# Patient Record
Sex: Female | Born: 1995 | Race: Black or African American | Hispanic: No | Marital: Single | Smoking: Never smoker
Health system: Southern US, Community
[De-identification: ages and names within clinical notes are randomized; demographics above are authoritative.]

## PROBLEM LIST (undated history)

## (undated) DIAGNOSIS — G119 Hereditary ataxia, unspecified: Secondary | ICD-10-CM

## (undated) DIAGNOSIS — K219 Gastro-esophageal reflux disease without esophagitis: Secondary | ICD-10-CM

---

## 2015-07-13 ENCOUNTER — Encounter (HOSPITAL_COMMUNITY): Payer: Self-pay | Admitting: Emergency Medicine

## 2015-07-13 ENCOUNTER — Emergency Department (HOSPITAL_COMMUNITY)
Admission: EM | Admit: 2015-07-13 | Discharge: 2015-07-27 | Disposition: A | Discharge status: Home / Self Care | Payer: Medicaid Other | Attending: Emergency Medicine | Admitting: Emergency Medicine

## 2015-07-13 DIAGNOSIS — R45851 Suicidal ideations: Secondary | ICD-10-CM | POA: Diagnosis present

## 2015-07-13 DIAGNOSIS — F4325 Adjustment disorder with mixed disturbance of emotions and conduct: Secondary | ICD-10-CM | POA: Diagnosis present

## 2015-07-13 DIAGNOSIS — Z79899 Other long term (current) drug therapy: Secondary | ICD-10-CM | POA: Diagnosis not present

## 2015-07-13 HISTORY — DX: Hereditary ataxia, unspecified: G11.9

## 2015-07-13 LAB — COMPREHENSIVE METABOLIC PANEL
ALT: 17 U/L (ref 14–54)
ANION GAP: 8 (ref 5–15)
AST: 17 U/L (ref 15–41)
Albumin: 4.6 g/dL (ref 3.5–5.0)
Alkaline Phosphatase: 53 U/L (ref 38–126)
BILIRUBIN TOTAL: 0.6 mg/dL (ref 0.3–1.2)
BUN: 6 mg/dL (ref 6–20)
CO2: 23 mmol/L (ref 22–32)
Calcium: 9.7 mg/dL (ref 8.9–10.3)
Chloride: 108 mmol/L (ref 101–111)
Creatinine, Ser: 0.81 mg/dL (ref 0.44–1.00)
Glucose, Bld: 89 mg/dL (ref 65–99)
POTASSIUM: 3.9 mmol/L (ref 3.5–5.1)
SODIUM: 139 mmol/L (ref 135–145)
TOTAL PROTEIN: 7.8 g/dL (ref 6.5–8.1)

## 2015-07-13 LAB — RAPID URINE DRUG SCREEN, HOSP PERFORMED
Amphetamines: NOT DETECTED
BARBITURATES: NOT DETECTED
BENZODIAZEPINES: NOT DETECTED
COCAINE: NOT DETECTED
OPIATES: NOT DETECTED
TETRAHYDROCANNABINOL: NOT DETECTED

## 2015-07-13 LAB — CBC WITH DIFFERENTIAL/PLATELET
BASOS ABS: 0 10*3/uL (ref 0.0–0.1)
BASOS PCT: 1 %
EOS ABS: 0.2 10*3/uL (ref 0.0–0.7)
Eosinophils Relative: 3 %
HEMATOCRIT: 41 % (ref 36.0–46.0)
HEMOGLOBIN: 13.4 g/dL (ref 12.0–15.0)
Lymphocytes Relative: 43 %
Lymphs Abs: 2.7 10*3/uL (ref 0.7–4.0)
MCH: 25 pg — ABNORMAL LOW (ref 26.0–34.0)
MCHC: 32.7 g/dL (ref 30.0–36.0)
MCV: 76.4 fL — ABNORMAL LOW (ref 78.0–100.0)
Monocytes Absolute: 0.5 10*3/uL (ref 0.1–1.0)
Monocytes Relative: 8 %
NEUTROS ABS: 2.9 10*3/uL (ref 1.7–7.7)
NEUTROS PCT: 45 %
Platelets: 296 10*3/uL (ref 150–400)
RBC: 5.37 MIL/uL — ABNORMAL HIGH (ref 3.87–5.11)
RDW: 14.8 % (ref 11.5–15.5)
WBC: 6.3 10*3/uL (ref 4.0–10.5)

## 2015-07-13 LAB — ETHANOL

## 2015-07-13 NOTE — ED Notes (Signed)
TTS consult in process at bedside at this time. 
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
## 2015-07-13 NOTE — ED Notes (Signed)
Pt laying in bed, watching TV. Pt NAD. Sitter at bedside.

## 2015-07-13 NOTE — BH Assessment (Signed)
Assessment completed. Consulted Alberteen SamFran Hobson, NP who recommended inpatient treatment. TTS to seek placement. Informed Dr. Anitra LauthPlunkett of the recommendation.

## 2015-07-13 NOTE — ED Notes (Signed)
TTS consult completed 

## 2015-07-13 NOTE — ED Notes (Signed)
Pt changed into scrubs, belongings placed in bag and placed under nurses desk.
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
## 2015-07-13 NOTE — ED Notes (Signed)
Bed: WA25 Expected date:  Expected time:  Means of arrival:  Comments: SI attempt 

## 2015-07-14 DIAGNOSIS — F4325 Adjustment disorder with mixed disturbance of emotions and conduct: Secondary | ICD-10-CM

## 2015-07-14 MED ORDER — IBUPROFEN 800 MG PO TABS
800.0000 mg | ORAL_TABLET | Freq: Once | ORAL | Status: AC
  Administered 2015-07-14: 800 mg via ORAL
  Filled 2015-07-14: qty 1
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
## 2015-07-14 NOTE — [facility] Suicide Risk Assessment (Signed)
Suicide Risk Assessment  Discharge Assessment   [facility]   Principal Problem: Adjustment disorder with mixed disturbance of emotions and conduct Discharge Diagnoses:  Patient Active Problem List   Diagnosis Date Noted  . Adjustment disorder with mixed disturbance of emotions and conduct [F43.25] 07/14/2015    Priority: High    Total Time spent with patient: 45 minutes   Musculoskeletal: Strength & Muscle Tone: decreased Gait & Station: unsteady Patient leans: N/A  Psychiatric Specialty Exam: Physical Exam  Constitutional: She is oriented to person, place, and time. She appears well-developed and well-nourished.  HENT:  Head: Normocephalic.  Neck: Normal range of motion.  Respiratory: Effort normal.  Musculoskeletal: Normal range of motion.  Neurological: She is alert and oriented to person, place, and time.  Skin: Skin is warm and dry.  Psychiatric: Her speech is normal and behavior is normal. Judgment and thought content normal. Her mood appears anxious. Cognition and memory are normal.    Review of Systems  Constitutional: Negative.   HENT: Negative.   Eyes: Negative.   Respiratory: Negative.   Cardiovascular: Negative.   Gastrointestinal: Negative.   Genitourinary: Negative.   Musculoskeletal: Negative.   Skin: Negative.   Neurological: Negative.   Endo/Heme/Allergies: Negative.   Psychiatric/Behavioral: The patient is nervous/anxious.     Blood pressure 126/67, pulse 94, temperature 98.2 F (36.8 C), temperature source Oral, resp. rate 17, SpO2 98 %.There is no height or weight on file to calculate BMI.  General Appearance: Casual  Eye Contact:  Good  Speech:  Normal Rate, slightly slow due to ataxia   Volume:  Normal  Mood:  Euthymic  Affect:  Congruent  Thought Process:  Coherent and Descriptions of Associations: Intact  Orientation:  Full (Time, Place, and Person)  Thought Content:  WDL  Suicidal Thoughts:  No  Homicidal  Thoughts:  No  Memory:  Immediate;   Good Recent;   Good Remote;   Good  Judgement:  Fair  Insight:  Fair  Psychomotor Activity:  Decreased  Concentration:  Concentration: Good and Attention Span: Good  Recall:  Good  Fund of Knowledge:  Good  Language:  Good  Akathisia:  No  Handed:  Right  AIMS (if indicated):     Assets:  Housing Leisure Time Physical Health Resilience Social Support  ADL's:  Intact  Cognition:  WNL  Sleep:       Mental Status Per Nursing Assessment::   On Admission:   IVC'd by her sister   Demographic Factors:  Adolescent or young adult  Loss Factors: NA  Historical Factors: NA  Risk Reduction Factors:   Sense of responsibility to family, Living with another person, especially a relative and Positive social support  Continued Clinical Symptoms:  None  Cognitive Features That Contribute To Risk:  None    Suicide Risk:  Minimal: No identifiable suicidal ideation.  Patients presenting with no risk factors but with morbid ruminations; may be classified as minimal risk based on the severity of the depressive symptoms    Plan Of Care/Follow-up recommendations:  Activity:  as tolerated Diet:  heart healthy diet  Nayda Riesen, NP 07/14/2015, 12:14 PM

## 2015-07-14 NOTE — Progress Notes (Signed)
Spoke with Karen QuillMagistrate Thomas who stated he was not going to re-designate her as an IVC patient.  Awaiting placement/decision from Department of Social Services.  Barrie LymeVance, Onesimo Lingard E RN 8:46 PM 07/14/2015
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
## 2015-07-14 NOTE — ED Notes (Signed)
Pt sitting up in bed, remains watching TV, NAD. Sitter at bedside

## 2015-07-14 NOTE — ED Notes (Signed)
Pt appears to be sleeping in bed, respirations even and unlabored. Sitter remains at bedside.
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
## 2015-07-15 MED ORDER — ACETAMINOPHEN 325 MG PO TABS: 650.0000 mg | ORAL_TABLET | Freq: Once | ORAL | Status: DC

## 2015-07-15 MED ORDER — ACETAMINOPHEN 325 MG PO TABS
650.0000 mg | ORAL_TABLET | ORAL | Status: DC | PRN
  Administered 2015-07-15 – 2015-07-27 (×8): 650 mg via ORAL
  Filled 2015-07-15 (×9): qty 2

## 2015-07-15 NOTE — ED Notes (Signed)
Pt now c/o back pain.

## 2015-07-16 MED ORDER — CALCIUM CARBONATE-VITAMIN D 500-200 MG-UNIT PO TABS
1.0000 | ORAL_TABLET | Freq: Every day | ORAL | Status: DC
  Administered 2015-07-16 – 2015-07-27 (×12): 1 via ORAL
  Filled 2015-07-16 (×12): qty 1

## 2015-07-16 MED ORDER — LORATADINE 10 MG PO TABS
10.0000 mg | ORAL_TABLET | Freq: Every day | ORAL | Status: DC
  Administered 2015-07-16 – 2015-07-27 (×12): 10 mg via ORAL
  Filled 2015-07-16 (×12): qty 1

## 2015-07-16 NOTE — ED Notes (Addendum)
Mother Delfin EdisMelenee Burrell, would like to speak with a Child psychotherapistocial Worker.  Mother's number is [phone].  Social Worker informed of mother's desire to speak with her.  Mother would like to petition for patient's guardianship and stated, "Since her sister don't want her, I don't want her to go to a home."  Mother [place] in [place], [place].

## 2015-07-16 NOTE — Progress Notes (Addendum)
Phoned pharmacy to see if they can find out the pharmacy the pt used while living in [place] . Per mom pt needs her medications but the pt is only able to tell the writer she takes vitamins and something for her allergies. Pt stated she would get her meds [facility] [place]. Pt c/o right hip pain . 2 tylenol given. (12:45p) Pt was assisted to the BR with her walker. Had a large BM. Pt was set up for lunch and is eating w/o assistance. She remains pleasant and cooperative. Pt [place]. Spoke to American FinancialJosh Greenberg who stated the last time the pt had her meds was on May 9th 2017 prior to her moving to [place]. Josh stated at that time the pt received a progesterone injection and is due for this every 3 months. Phoned Abah in the pharmacy and gave her Tilford PillarJosh Greenberg's contact number : [phone]. Pt is s/p a shower with asssitance. Tolerated well. Pt is working on word find puzzles. (2:30pm) Spoke with EDP concerning med reconcilation. (2:45pm) Report to oncoming shift(3pm)

## 2015-07-16 NOTE — Progress Notes (Signed)
Provided written information to assist pt with determining choice for uninsured accepting pcps, discussed the importance of pcp vs EDP services for f/u care, www.needymeds.org, www.goodrx.com, discounted pharmacies and other [place] resources such as [facility] , [facility], affordable care act, financial assistance, uninsured dental services, [place] med assist, DSS and  health department  Reviewed resources for [place] uninsured accepting pcps like [facility], family medicine at [address], [facility], [facility], local urgent care centers, [facility], [facility] family practice, general medical clinics, family services of the [place], [facility] urgent care plus others, medication resources, [facility] out patient pharmacies and housing Pt voiced understanding and appreciation of resources provided   Provided P4CC contact information Pt [place] been [place] with her sister not a P4CC candidate at this time  Placed items in pt belonging bag in locker number #30

## 2015-07-16 NOTE — ED Notes (Signed)
Patient ate 100% of breakfast and drank juice, coffee, milk and water.
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
## 2015-07-16 NOTE — ED Notes (Signed)
Patient sitting upright in chair eating breakfast.

## 2015-07-16 NOTE — ED Notes (Signed)
Patient's earrings in the shape of a cross with clear stones were placed in her locker.

## 2015-07-16 NOTE — Progress Notes (Signed)
CSW spoke with Assistant CSW Director who has spoke with owner of [facility], Betty Davis who wants information regarding pt.  CSW will fax owner a referral packet in order to try and obtain appropriate placement.  Sofhia Ulibarri, LCSWA [phone] ED CSW 07/16/2015 6:34 PM  
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
## 2015-07-16 NOTE — ED Notes (Signed)
Patient ambulated to bathroom with some assistance.

## 2015-07-16 NOTE — ED Notes (Signed)
Patient states her best friend Ermalinda Memosngelika, is going to bring her mother to visit from [place].  Angelika's number is [phone].
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
## 2015-07-17 MED ORDER — DIPHENHYDRAMINE HCL 25 MG PO CAPS
25.0000 mg | ORAL_CAPSULE | Freq: Once | ORAL | Status: AC
  Administered 2015-07-17: 25 mg via ORAL
  Filled 2015-07-17: qty 1
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
## 2015-07-17 NOTE — Progress Notes (Signed)
Attempted call to [facility] [phone] [facility] or Malon Kindlenthony [facility] [phone]. Unable to leave messages on either line.  Message sent to Jinny SandersBetty Davis, [facility] regarding placement. Waiting for response.  Elenore PaddyLaVonia Shawntavia Saunders, LCSWA [phone] ED CSW 8:28 AM 07/17/2015

## 2015-07-17 NOTE — Progress Notes (Signed)
Sister and sister's boyfriend [facility]. She also had several large bins of clothes and other personal belongings. Nurse informed boyfriend that there was not enough storage to leave all of the patient's bin. Therefore, only the walker was left behind.  Boyfriend states that him and patient's sister are currently sharing a car, but that when he gets off of work he will take the patient the rest of her belongings.   Trish MageBrittney Malyah Ohlrich, LCSWA [phone] ED CSW 07/17/2015 8:43 PM

## 2015-07-17 NOTE — Progress Notes (Signed)
CSW reached out to sister/guardian. She confirms that she is aware that pt has been [facility] a [facility].  CSW informed sister that she has spoken with physical therapy who states that pt will need her walker in order to ambulate. CSW asked sister would she be able to [facility] the [facility]'s [facility] or to her new care home. Sister states that she will bring the walker to [facility] tonight.  CSW made nurse and PT aware.  Trish MageBrittney Mable Dara, LCSWA [phone] ED CSW 07/17/2015 4:58 PM

## 2015-07-17 NOTE — Progress Notes (Addendum)
CSW met with Karen Hart with [facility] and she assessed patient in the ED. She stated she will accept patient at her facility in the morning (07-18-15) around 9:00am. Updated NP and Asst. Social Work Mudlogger.  CSW spoke with patient's legal guardian, Karen Hart to inform her that patient will be going to [facility] in the morning around 9:00am. CSW informed her patient asked about her clothes and she stated she could take them to the transition house. CSW provided her with the address and will call back and give her a contact number for Karen Hart.  CSW called back and provided Karen Hart, Legal Guardian the contact number for [employer]. Contact number is [phone].  [facility] [address] [place], [place] [address]   Genice Rouge [phone] ED CSW 07/17/2015 1:01 PM

## 2015-07-17 NOTE — Evaluation (Signed)
Physical Therapy Evaluation Patient Details Name: Karen Hart MRN: [id number] DOB: 1995-09-19 Today's Date: 07/17/2015   History of Present Illness  Pt with history of cerebellar ataxia presents with ataxic movements and generalized weakness from decreased mobility in recent month.   Clinical Impression  Very pleasant and sweet session with patient today. Pt with history of ataxic movement patterns evident upon evaluation of BUES and BLEs as well. Difficult to assess in one session, however today with little mobility patient seemed to fatigue quickly with some generalized deconditioning on top of her challenge with her ataxic gait needing cues , RW assistance for safety to keep it near, and min assist for ambulation to assure safety. Pt will need to have assistance and her RW  (at least initial until patient can prove otherwise) for all mobility and ADLS for she is a fall risk.  She will benefit from walking short household distances with assistance to maintain and help improve her activity tolerance and safety with gait. This can be accomplished by HHPT or a caregiver . We will continue to see her while she is here at [facility]. I was not able to test, however feel patient would not be able to navigate steps, she would have to have a ramp, and possibly a WC at times.     Follow Up Recommendations Home health PT    Equipment Recommendations   (pt has a 4 wheeled RW , her sister stated she would bring it up to the hopsital for pateint to use. IF not , patient will have to have RW ordered for her, preferably the 4 wheeled RW due to her ataxia diagnosis if possible. )    Recommendations for Other Services       Precautions / Restrictions Precautions Precautions: Fall Precaution Comments: feel patient is a fall risk and at this time would need someone with her while she is walkign and trying to go to the bathroom and mobilize. Pt fatigues easily too.       Mobility  Bed Mobility Overal bed  mobility: Modified Independent                Transfers Overall transfer level: Needs assistance Equipment used: Rolling walker (2 wheeled) Transfers: Sit to/from Stand Sit to Stand: Min guard         General transfer comment: little assistance to rise and RW use safely   Ambulation/Gait Ambulation/Gait assistance: Min assist Ambulation Distance (Feet): 18 Feet Assistive device: Rolling walker (2 wheeled) Gait Pattern/deviations: Step-through pattern;Ataxic     General Gait Details: pt fatgued quickly and noticable in UES and LES, BLES atacix with gait, and continued verbal and tactile cues for RW safety for she pushed it too far in front of her with each step. She listend and corrected with assist to prevent any loss of balance.   Stairs            Wheelchair Mobility    Modified Rankin (Stroke Patients Only)       Balance Overall balance assessment: Needs assistance Sitting-balance support: No upper extremity supported Sitting balance-Leahy Scale: Good     Standing balance support: Bilateral upper extremity supported Standing balance-Leahy Scale: Fair                               Pertinent Vitals/Pain Pain Assessment: No/denies pain    Home Living Family/patient expects to be discharged to::  (unsure, social worker is working  with this. Pt was in [place] with her mom, until her mom coul dnot longer care for her. She has recently come to [place] where her sister is caring for her,. )                      Prior Function Level of Independence: Independent with assistive device(s)         Comments: difficult to gather accurate timeline. Pt states she usually uses her walker (sounds like a 4 wheeled walker with brakes , seat and a basket) short distances, and doe snot have a wheelchair. People push her while sitting on teh walker seat, however this scares her for she knows it can tip.      Hand Dominance         Extremity/Trunk Assessment               Lower Extremity Assessment: Generalized weakness (generalized weakness, with ataxic movment patterns equally in BLEs and BUEs as well. Pt fatigues quickly , and has poor trunk strength compensating with trunk flexion for shoulder elevation. )         Communication   Communication: Expressive difficulties (mild expressive dysarthria, takes increased time and some fragmented sentences, but able to understand and follow her thoughts and communication. )  Cognition Arousal/Alertness: Awake/alert Behavior During Therapy: WFL for tasks assessed/performed Overall Cognitive Status: Within Functional Limits for tasks assessed                      General Comments      Exercises Other Exercises Other Exercises: perfomed B LE ankle pumps 10x, hip and knee flexion exercises 10 BLE, staraight leg raises 10x BLE.       Assessment/Plan    PT Assessment Patient needs continued PT services  PT Diagnosis Difficulty walking;Generalized weakness   PT Problem List Decreased strength;Decreased activity tolerance;Decreased balance;Decreased mobility;Decreased knowledge of use of DME  PT Treatment Interventions Gait training;Functional mobility training;Therapeutic activities;Therapeutic exercise   PT Goals (Current goals can be found in the Care Plan section) Acute Rehab PT Goals Patient Stated Goal: I want to move more for I get stiff.  PT Goal Formulation: With patient Time For Goal Achievement: 07/31/15 Potential to Achieve Goals: Good    Frequency Min 2X/week   Barriers to discharge        Co-evaluation               End of Session Equipment Utilized During Treatment: Gait belt Activity Tolerance: Patient tolerated treatment well Patient left: in bed Nurse Communication: Mobility status    Functional Assessment Tool Used: clinical judgement Functional Limitation: Mobility: Walking and moving around Mobility: Walking and  Moving Around Current Status (Z6109): At least 20 percent but less than 40 percent impaired, limited or restricted Mobility: Walking and Moving Around Goal Status [phone]): At least 1 percent but less than 20 percent impaired, limited or restricted    Time: 0981-1914 PT Time Calculation (min) (ACUTE ONLY): 25 min   Charges:   PT Evaluation $PT Eval Low Complexity: 1 Procedure PT Treatments $Gait Training: 8-22 mins   PT G Codes:   PT G-Codes **NOT FOR INPATIENT CLASS** Functional Assessment Tool Used: clinical judgement Functional Limitation: Mobility: Walking and moving around Mobility: Walking and Moving Around Current Status (N8295): At least 20 percent but less than 40 percent impaired, limited or restricted Mobility: Walking and Moving Around Goal Status [phone]): At least 1 percent but less than 20  percent impaired, limited or restricted    Gregary Blackard, Clois DupesSHARRON 07/17/2015, 5:08 PM Marella BileSharron Jemiah Ellenburg, PT Pager: [phone] 07/17/2015

## 2015-07-17 NOTE — ED Notes (Signed)
Pt c/o itching all over her back, benadryl given
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
## 2015-07-18 NOTE — NC FL2 (Signed)
  [place] MEDICAID FL2 LEVEL OF CARE SCREENING TOOL     IDENTIFICATION  Patient Name: Karen Hart Birthdate: 08/10/1995 Sex: female Admission Date (Current Location): 07/13/2015  [facility] [place]   Facility and Address:  [facility],  [address], [place] [id number]      Provider Number: [id number]  Attending Physician Name and Address:  Provider Default, MD  Relative Name and Phone Number:       Current Level of Care: Hospital Recommended Level of Care: Family Care Home Prior Approval Number:    Date Approved/Denied:   PASRR Number:    Discharge Plan: Other (Comment) (Family Care Home)    Current Diagnoses: Patient Active Problem List   Diagnosis Date Noted  . 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    Orientation RESPIRATION BLADDER Height & Weight     Self  Normal Continent Weight:   Height:     BEHAVIORAL SYMPTOMS/MOOD NEUROLOGICAL BOWEL NUTRITION STATUS      Continent Diet  AMBULATORY STATUS COMMUNICATION OF NEEDS Skin   Limited Assist (Per Nurse, needs stand by short distances, has the four wheel walker) Verbally Normal                       Personal Care Assistance Level of Assistance  Bathing, Feeding, Dressing Bathing Assistance: Limited assistance Feeding assistance: Independent (Per Nurse, can feed self after set-up) Dressing Assistance: Limited assistance     Functional Limitations Info  Sight, Hearing, Speech Sight Info: Adequate (Patient wears glasses) Hearing Info: Adequate Speech Info: Adequate    SPECIAL CARE FACTORS FREQUENCY                       Contractures      Additional Factors Info  Code Status, Allergies (Full Code) Code Status Info:  (Full Code) Allergies Info:  (Latex)           Current Medications (07/18/2015):  This is the current hospital active medication list Current Facility-Administered Medications  Medication Dose Route  Frequency Provider Last Rate Last Dose  . acetaminophen (TYLENOL) tablet 650 mg  650 mg Oral Q4H PRN Antony MaduraKelly Humes, PA-C   650 mg at 07/16/15 1244  . calcium-vitamin D (OSCAL WITH D) 500-200 MG-UNIT per tablet 1 tablet  1 tablet Oral Daily Lorre NickAnthony Allen, MD   1 tablet at 07/17/15 [id number]  . loratadine (CLARITIN) tablet 10 mg  10 mg Oral Daily Lorre NickAnthony Allen, MD   10 mg at 07/17/15 [id number]   Current Outpatient Prescriptions  Medication Sig Dispense Refill  . Calcium Carbonate-Vitamin D (CALCIUM-VITAMIN D) 500-200 MG-UNIT tablet Take 1 tablet by mouth daily.    . cetirizine (ZYRTEC) 10 MG tablet Take 10 mg by mouth daily.    Marland Kitchen. escitalopram (LEXAPRO) 20 MG tablet Take 20 mg by mouth daily. Reported on 07/16/2015    . ferrous sulfate 325 (65 FE) MG EC tablet Take 325 mg by mouth daily with breakfast.    . MAGNESIUM SULFATE PO Take 1 tablet by mouth daily.       Discharge Medications: Please see discharge summary for a list of discharge medications.  Relevant Imaging Results:  Relevant Lab Results:   Additional Information    Claudean SeveranceLaVonia M Zareen Jamison, LCSW

## 2015-07-18 NOTE — Progress Notes (Signed)
Per chart and registration the pt not have any insurance listed.  CSW reached out to sister who states that she has found a medicaid number for pt : [id number] Marin OlpO  Tiani Stanbery, LCSWA [phone] ED CSW 07/18/2015 10:44 PM

## 2015-07-18 NOTE — Progress Notes (Addendum)
CSW faxed patient's information this AM to Clarene Essexonya Torain [facility] for [facility]. Fax# [phone]. Waiting for response to see if patient is appropriate for their facility.  Elenore PaddyLaVonia Jesselle Laflamme, LCSWA [phone] ED CSW 07/18/2015 2:47 PM

## 2015-07-18 NOTE — Progress Notes (Signed)
CSW reached out to sister to inquire about pt income.  Sister states that she is not sure how pt was paying for her previous facility because at that time the pt was a ward of the state. According to sister, the pt was receiving a disability check, but it got cut off. She states that the pt is supposed to start receiving a survivors check, but it has not started yet.  Sister states that she has no questions for CSW at this time.  Trish MageBrittney Aryannah Mohon, LCSWA [phone] ED CSW 07/18/2015 9:07 PM

## 2015-07-18 NOTE — Progress Notes (Signed)
CSW spoke with patient's legal guardian, Ovidio Kinovella Rivera to inform her that [facility] would not be [facility] with Jinny SandersBetty Davis. CSW will follow-up.  Elenore PaddyLaVonia Dayvian Blixt, LCSWA [phone] ED CSW 07/18/2015 9:26 AM

## 2015-07-18 NOTE — ED Notes (Signed)
Writer took pt for a short walk with her 4 point walker to get her legs some exercise. When return to room writer put pt in a wheel chair and took pt outside for fresh air and sunlight. Pt enjoyed being outside. Writer also took pt to Environmental health practitionergift shop and writer brought pt a Office managerfidget spinner and a few snacks pt was very thankful. Pt is having a great day.

## 2015-07-18 NOTE — ED Notes (Signed)
Pt c/o back itching.  Lotion applied.  Pt now stating the itching has stopped.

## 2015-07-19 LAB — URINALYSIS, ROUTINE W REFLEX MICROSCOPIC
Bilirubin Urine: NEGATIVE
GLUCOSE, UA: NEGATIVE mg/dL
Hgb urine dipstick: NEGATIVE
KETONES UR: NEGATIVE mg/dL
LEUKOCYTES UA: NEGATIVE
NITRITE: NEGATIVE
PH: 6 (ref 5.0–8.0)
Protein, ur: NEGATIVE mg/dL
SPECIFIC GRAVITY, URINE: 1.009 (ref 1.005–1.030)

## 2015-07-19 NOTE — Progress Notes (Signed)
Physical Therapy Treatment Patient Details Name: Karen Hart MRN: [id number] DOB: 02/10/1995 Today's Date: 07/19/2015    History of Present Illness Pt with history of cerebellar ataxia presents with ataxic movements and generalized weakness from decreased mobility in recent month.     PT Comments    The  Patient has significant ataxia and is a fall risk. The patient requires 1 assist for ambulation using 4 wheeled RW. The patient may benefit from a small, light weight WC to self mobilize more safely. This would improve patient's ability to mobilize in a facility/grouphome. PT Will return and assess the patient with transfers and  WC mobility.  Follow Up Recommendations  Home health PT     Equipment Recommendations  Wheelchair (measurements PT);Wheelchair cushion (measurements PT)    Recommendations for Other Services       Precautions / Restrictions Precautions Precautions: Fall Precaution Comments: feel patient is a fall risk and at this time would need someone with her while she is walkign and trying to go to the bathroom and mobilize. Pt fatigues easily too.     Mobility  Bed Mobility                  Transfers Overall transfer level: Needs assistance Equipment used: 4-wheeled walker Transfers: Sit to/from Stand Sit to Stand: Min guard         General transfer comment: little assistance to rise and RW use safely   Ambulation/Gait Ambulation/Gait assistance: Min assist Ambulation Distance (Feet): 50 Feet Assistive device: 4-wheeled walker Gait Pattern/deviations: Step-to pattern;Step-through pattern;Wide base of support;Ataxic;Shuffle;Decreased weight shift to right;Decreased weight shift to left;Trunk flexed   Gait velocity interpretation: <1.8 ft/sec, indicative of risk for recurrent falls General Gait Details: pt locks brakes apprropriately, stops frequently to get trunk into RW, also needs to stop to rest, pushes  RW far ahead at times.   Stairs             Wheelchair Mobility    Modified Rankin (Stroke Patients Only)       Balance Overall balance assessment: Needs assistance   Sitting balance-Leahy Scale: Normal     Standing balance support: Bilateral upper extremity supported;During functional activity Standing balance-Leahy Scale: Poor                      Cognition Arousal/Alertness: Awake/alert                          Exercises      General Comments        Pertinent Vitals/Pain Pain Assessment: No/denies pain    Home Living                      Prior Function            PT Goals (current goals can now be found in the care plan section) Progress towards PT goals: Progressing toward goals    Frequency  Min 2X/week    PT Plan Current plan remains appropriate    Co-evaluation             End of Session Equipment Utilized During Treatment: Gait belt Activity Tolerance: Patient tolerated treatment well Patient left: in chair;with call bell/phone within reach;with nursing/sitter in room     Time: 0900-0926 PT Time Calculation (min) (ACUTE ONLY): 26 min  Charges:  $Gait Training: 23-37 mins  G Codes:      Karen Hart, Karen Hart 07/19/2015, 12:04 PM Karen Hart PT [phone]
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
## 2015-07-19 NOTE — Evaluation (Signed)
Occupational Therapy Evaluation Patient Details Name: Karen Hart MRN: [id number] DOB: 01/21/1996 Today's Date: 07/19/2015    History of Present Illness Pt with history of cerebellar ataxia presents with ataxic movements and generalized weakness from decreased mobility in recent month.    Clinical Impression   Pt admitted with above. She demonstrates the below listed deficits and will benefit from continued OT to maximize safety and independence with BADLs.  Pt presents to OT with ataxia and likely baseline deficits.  She currently requires min - mod A for ADLs.  She was provided with adaptive equipment to improve her independence with bathing, dressing, self feeding.  Will follow acutely.  Feel she will benefit from continued OT at discharge, but this will be dependent upon discharge disposition.         Follow Up Recommendations  Home health OT;SNF;Supervision/Assistance - 24 hour    Equipment Recommendations  3 in 1 bedside comode;Wheelchair (measurements OT);Tub/shower bench (DME will be dependent on discharge disposition )    Recommendations for Other Services       Precautions / Restrictions Precautions Precautions: Fall Precaution Comments: Pt is fall risk due to ataxia       Mobility Bed Mobility                  Transfers Overall transfer level: Needs assistance Equipment used: 4-wheeled walker Transfers: Sit to/from BJ'sStand;Stand Pivot Transfers Sit to Stand: Min guard Stand pivot transfers: Min assist       General transfer comment: Pt requires cues for sequencing, proper set up of wheelchair and proper hand placement     Balance Overall balance assessment: Needs assistance Sitting-balance support: Feet supported Sitting balance-Leahy Scale: Good     Standing balance support: Bilateral upper extremity supported Standing balance-Leahy Scale: Poor                              ADL Overall ADL's : Needs  assistance/impaired Eating/Feeding: Set up;Minimal assistance;Sitting Eating/Feeding Details (indicate cue type and reason): frequently drops items.  difficulty manipulating utensil Grooming: Wash/dry hands;Wash/dry face;Oral care;Minimal assistance;Supervision/safety;Sitting Grooming Details (indicate cue type and reason): variable performance due to ataxia  Upper Body Bathing: Moderate assistance;Sitting Upper Body Bathing Details (indicate cue type and reason): frequently drops soap and washcloth  Lower Body Bathing: Maximal assistance;Sit to/from stand Lower Body Bathing Details (indicate cue type and reason): frequently drops soap and washcloth  Upper Body Dressing : Maximal assistance;Sitting   Lower Body Dressing: Moderate assistance;Sit to/from stand Lower Body Dressing Details (indicate cue type and reason): Pt is able to don/doff socks Toilet Transfer: Min Barrister's clerkguard;Stand-pivot;BSC Toilet Transfer Details (indicate cue type and reason): instructed pt on correct positioning of DME, and safe hand placement  Toileting- Clothing Manipulation and Hygiene: Moderate assistance;Sit to/from stand       Functional mobility during ADLs: Min guard;Minimal assistance General ADL Comments: Pt provided with wash mitten, foam built up handles for utensils and lidded mug.  She and sitter were instructed in their use and pt able to return demo with supervision      Vision     Perception     Praxis      Pertinent Vitals/Pain Pain Assessment: No/denies pain     Hand Dominance     Extremity/Trunk Assessment Upper Extremity Assessment Upper Extremity Assessment: RUE deficits/detail;LUE deficits/detail RUE Deficits / Details: ataxia  RUE Coordination: decreased gross motor;decreased fine motor LUE Deficits / Details: ataxia  LUE Coordination:  decreased fine motor;decreased gross motor   Lower Extremity Assessment Lower Extremity Assessment: Defer to PT evaluation   Cervical / Trunk  Assessment Cervical / Trunk Assessment: Other exceptions Cervical / Trunk Exceptions: truncal ataxia    Communication Communication Communication: Expressive difficulties;Other (comment) (dysarthria)   Cognition Arousal/Alertness: Awake/alert Behavior During Therapy: WFL for tasks assessed/performed Overall Cognitive Status: History of cognitive impairments - at baseline                     General Comments       Exercises Exercises: Other exercises Other Exercises Other Exercises: Pt was provided with red theraputty and was instructed in pinching exercises - requires mod cues    Shoulder Instructions      Home Living Family/patient expects to be discharged to:: Unsure                                 Additional Comments: Pt previously was living in a group home, then with her sister       Prior Functioning/Environment Level of Independence: Needs assistance  Gait / Transfers Assistance Needed: h/o falls per chart ADL's / Homemaking Assistance Needed: Pt reports she has been receiving help for ADLs since she graduated from high school         OT Diagnosis: Generalized weakness;Cognitive deficits;Ataxia   OT Problem List: Impaired balance (sitting and/or standing);Decreased coordination;Decreased safety awareness;Decreased knowledge of use of DME or AE;Impaired UE functional use   OT Treatment/Interventions: Self-care/ADL training;Neuromuscular education;Therapeutic exercise;DME and/or AE instruction;Therapeutic activities;Patient/family education;Balance training    OT Goals(Current goals can be found in the care plan section) Acute Rehab OT Goals Patient Stated Goal: Pt did not state  OT Goal Formulation: With patient Time For Goal Achievement: 08/02/15 Potential to Achieve Goals: Good ADL Goals Pt Will Perform Eating: with set-up;with adaptive utensils;sitting Pt Will Perform Upper Body Bathing: with set-up;sitting;with adaptive equipment Pt  Will Perform Lower Body Bathing: with min assist;with adaptive equipment;sit to/from stand Pt Will Perform Upper Body Dressing: with min assist;with adaptive equipment;sitting Pt Will Perform Lower Body Dressing: with min assist;with adaptive equipment;sit to/from stand Pt Will Transfer to Toilet: with min guard assist;stand pivot transfer;bedside commode Pt Will Perform Toileting - Clothing Manipulation and hygiene: with min assist;sit to/from stand Pt/caregiver will Perform Home Exercise Program: Increased strength;Right Upper extremity;Left upper extremity;With theraband;With Supervision;With written HEP provided  OT Frequency: Min 2X/week   Barriers to D/C: Decreased caregiver support          Co-evaluation              End of Session Equipment Utilized During Treatment: Other (comment) (wc) Nurse Communication: Mobility status  Activity Tolerance: Patient tolerated treatment well Patient left: in chair;with call bell/phone within reach;with nursing/sitter in room   Time: 1549-1626 OT Time Calculation (min): 37 min Charges:  OT General Charges $OT Visit: 1 Procedure OT Evaluation $OT Eval Moderate Complexity: 1 Procedure OT Treatments $Self Care/Home Management : 8-22 mins G-Codes: OT G-codes **NOT FOR INPATIENT CLASS** Functional Limitation: Self care Self Care Current Status (U9811(G8987): At least 40 percent but less than 60 percent impaired, limited or restricted Self Care Goal Status (B1478(G8988): At least 20 percent but less than 40 percent impaired, limited or restricted  Brinda Focht M 07/19/2015, 5:01 PM

## 2015-07-19 NOTE — Progress Notes (Signed)
Physical Therapy Treatment Patient Details Name: Horton Finerydrah Chilson MRN: [id number] DOB: 04/07/1995 Today's Date: 07/19/2015    History of Present Illness Pt with history of cerebellar ataxia presents with ataxic movements and generalized weakness from decreased mobility in recent month.     PT Comments    The patient was evaluated for  Wheelchair mobility. She required cues and min guard for transfers, cues for mobilizing with the  WC using both legs and arms. Demonstrated improved technique with practice. . The ataxia of extremities is somewhat limiting in control of  The legs and arms, cautioned her to not get feet caught in wheels.The  patient  Was able to self propel 3280' with ocassional cues from PT and  Assist for turns in [facility]  by PT. The patient can benefit from a WC to  Safely mobilize versus using the RW. She is  Glorious PeachFreer to move around via a WC. Could utilize a WC  if  Discharged to a facility with out having to have a 1:1 assistant.  Follow Up Recommendations  Home health PT     Equipment Recommendations  Wheelchair (measurements PT);Wheelchair cushion (measurements PT) The patient fits in a 16 inch  Width WC.   Recommendations for Other Services       Precautions / Restrictions Precautions Precautions: Fall Precaution Comments: feel patient is a fall risk and at this time would need someone with her while she is walkign and trying to go to the bathroom and mobilize. Pt fatigues easily too.     Mobility  WC Mobility The patient was instructed in use of legs and arms to propel the WC. Coordination of the legs and arms was an issue initially. At times the patient just used arms  Or the legs separately. Instructed in use of 1 Wheel rim and feet to turn the WC. The patient was happy to be out and mobilizing.                   Transfers Overall transfer level: Needs assistance Equipment used:  (WC) Transfers: Stand Pivot Transfers Sit to Stand: Min guard Stand pivot transfers:  Min assist       General transfer comment: cues for locking WC brakes, cues for safety. Transferred from recliner to [facility].  Ambulation/Gait  Stairs            Wheelchair Mobility    Modified Rankin (Stroke Patients Only)       Balance Overall balance assessment: Needs assistance   Sitting balance-Leahy Scale: Normal     Standing balance support: Bilateral upper extremity supported;During functional activity Standing balance-Leahy Scale: Poor                      Cognition Arousal/Alertness: Awake/alert                          Exercises      General Comments        Pertinent Vitals/Pain Pain Assessment: No/denies pain    Home Living                      Prior Function            PT Goals (current goals can now be found in the care plan section) Progress towards PT goals: Progressing toward goals    Frequency  Min 2X/week    PT Plan Current plan remains appropriate    Co-evaluation  End of Session Equipment Utilized During Treatment: Gait belt Activity Tolerance: Patient tolerated treatment well Patient left: in chair;with call bell/phone within reach;with nursing/sitter in room (in [facility])     Time: [id number] PT Time Calculation (min) (ACUTE ONLY): 25 min  Charges:   $Wheel Chair Management: 23-37 mins                    G Codes:      Rada HayHill, Yashira Offenberger Elizabeth 07/19/2015, 12:27 PM

## 2015-07-19 NOTE — Progress Notes (Addendum)
CSW [facility] to [facility] to make care coordinator referral for patient. However, once speaking with the clinician/Margaret she states that a referral was automatically made due to this being the first crisis incident the pt has had.  CSW still currently working on [employer]. [place] must stated that information given was incorrect ... Either birthday or  SS number. CSW spoke with registration to verify the patient SS number, and it stated it was correct. CSW will continue to follow up.  Trish MageBrittney Whitaker, LCSWA [phone] ED CSW 07/19/2015 11:12 PM
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
## 2015-07-20 LAB — URINE CULTURE

## 2015-07-20 NOTE — Progress Notes (Addendum)
8:17am- CSW attempted to speak with patient's legal guardian, Karen Hart. Spoke with patient's boyfriend to see if social security number and date of birth are both correct. He stated they were getting ready to go in for a surgery and CSW would have to call back this evening.   Spoke with registration and they stated the information they have in the system is what is noted at this time.  Spoke with patient to obtain information. Patient looked at her arm band to tell CSW her DOB. She told CSW her social security number. The number she provided is the number in the system. Unable to complete application to obtain PASRR at this time.  CSW spoke with [facility] Hart at [facility] to complete the Care Coordination referral. He stated one had been completed because patient was flagged due to being in the hospital. He stated he would attempt to have patient's referral expedited and call back.   CSW attempted a telephone call to [facility], [place], [place] [phone] to identify a correct social security number for patient in efforts to complete application for PASRR number. Message left with name and contact number for return call.  Message sent to supervisor to see if patient's information would be appropriate to send to ALF through the hub.   [name], LCSWA [phone] ED CSW 07/20/2015 10:44 AM

## 2015-07-20 NOTE — Progress Notes (Addendum)
Report received. Pt is in the bed talking to her mom. Appears comfortable. 4:30pm Pt was assisted in the shower.Report to oncoming shift. (7;20pm)

## 2015-07-20 NOTE — Progress Notes (Signed)
CSW spoke with Judeth CornfieldStephanie of [facility] who states that she has received FL2. However, she states due to patient's age she has been declined.   Trish MageBrittney Shequita Peplinski, LCSWA [phone] ED CSW 07/20/2015 5:09 PM

## 2015-07-20 NOTE — Progress Notes (Signed)
Occupational Therapy Treatment Patient Details Name: Karen Hart MRN: [id number] DOB: 01/14/1996 Today's Date: 07/20/2015    History of present illness Pt with history of cerebellar ataxia presents with ataxic movements and generalized weakness from decreased mobility in recent month.    OT comments  Pt motivated but very distractible.  Pt is a high falls risk due to ataxia.    Follow Up Recommendations  Home health OT;SNF;Supervision/Assistance - 24 hour    Equipment Recommendations  3 in 1 bedside comode;Wheelchair (measurements OT);Tub/shower bench    Recommendations for Other Services      Precautions / Restrictions Precautions Precautions: Fall Precaution Comments: Pt is fall risk due to ataxia        Mobility Bed Mobility                  Transfers     Transfers: Sit to/from Stand Sit to Stand: Min guard Stand pivot transfers: Min guard       General transfer comment: very close guarding for safety due to ataxia. Did not need any hands on assistance.  Locked brakes herself    Balance                                   ADL       Grooming: Wash/dry face;Minimal assistance;Sitting                   Toilet Transfer: Min guard;Stand-pivot (w/c)             General ADL Comments: Pt used wash mitt to wash face.  Min A to ring wash mitt and apply. She was able to doff. C/o L wrist pain from previous fall.  Pt set up w/c herself, but she did a 180 degree turn.  Min guard for safety. Pt reports that built up foam is working well and she drank from lidded cup without difficulty when I was in the room.   Pt was very distractable and needed cues to sustain attention to task. See exercises below      Vision                     Perception     Praxis      Cognition   Behavior During Therapy: [facility] for tasks assessed/performed Overall Cognitive Status: History of cognitive impairments - at baseline                       Extremity/Trunk Assessment               Exercises Other Exercises Other Exercises: performed rolling and pinching of putty with cues for pad to pad to achieve tip pinch Other Exercises: Issued level one theraband for R biceps--tied to chair.  Pt was unable to tolerate L side due to wrist pain from fall.  AROM for bil shoulders as pt cannot isolate deltoids without recruiting biceps for shoulder flexion   Shoulder Instructions       General Comments      Pertinent Vitals/ Pain       Pain Assessment: No/denies pain  Home Living                                          Prior Functioning/Environment  Frequency Min 2X/week     Progress Toward Goals  OT Goals(current goals can now be found in the care plan section)  Progress towards OT goals: Progressing toward goals (depending upon disposition)     Plan      Co-evaluation                 End of Session     Activity Tolerance Patient tolerated treatment well   Patient Left  (w/c with call bell)   Nurse Communication  (pt wanted to remain up in w/c)        Time: [id number] OT Time Calculation (min): 33 min  Charges: OT General Charges $OT Visit: 1 Procedure OT Treatments $Self Care/Home Management : 8-22 mins $Therapeutic Exercise: 8-22 mins  Karen Hart 07/20/2015, 12:43 PM  Karen Hart, OTR/L [phone] 07/20/2015

## 2015-07-20 NOTE — Progress Notes (Addendum)
CSW obtained approval from supervisor to send patient's information through the hub to ALF for placement. Patient's information was sent, responses are pending.  Spoke with Clarene Essexonya Torain with [facility]. She declined patient, as she states she service elderly women only.  Contacted facilities for placement:  [facility] [phone]- no openings  [facility] [phone]- men only  [facility] [phone]- left message   [facility] [phone]- left message  [facility] [phone]- left message  [facility] [phone]- no openings  [facility] [phone]- no openings   Call received from Rosine BeatBonnie Waltlington from [facility]. She stated that she did not have any openings at the time, however, someone could call back next week, as she states she may have a discharge.   Elenore PaddyLaVonia Abid Bolla, LCSWA [phone] ED CSW 07/20/2015 1:50 PM

## 2015-07-20 NOTE — [place] FL2 (Signed)
  [place] MEDICAID FL2 LEVEL OF CARE SCREENING TOOL     IDENTIFICATION  Patient Name: Karen Hart Birthdate: 08/14/1995 Sex: female Admission Date (Current Location): 07/13/2015  [facility] [place]   Facility and Address:  [facility],  [address], [place] [id number]      Provider Number: [id number]  Attending Physician Name and Address:  Provider Default, MD  Relative Name and Phone Number:       Current Level of Care: Hospital Recommended Level of Care: Family Care Home Prior Approval Number:    Date Approved/Denied:   PASRR Number:    Discharge Plan: Other (Comment) (Family Care Home)    Current Diagnoses: Patient Active Problem List   Diagnosis Date Noted  . 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    Orientation RESPIRATION BLADDER Height & Weight     Self  Normal Continent Weight:   Height:     BEHAVIORAL SYMPTOMS/MOOD NEUROLOGICAL BOWEL NUTRITION STATUS      Continent Diet  AMBULATORY STATUS COMMUNICATION OF NEEDS Skin   Limited Assist (Per Nurse, needs stand by short distances, has the four wheel walker) Verbally Normal                       Personal Care Assistance Level of Assistance  Bathing, Feeding, Dressing Bathing Assistance: Limited assistance Feeding assistance: Independent (Per Nurse, can feed self after set-up) Dressing Assistance: Limited assistance     Functional Limitations Info  Sight, Hearing, Speech Sight Info: Adequate (Patient wears glasses) Hearing Info: Adequate Speech Info: Adequate    SPECIAL CARE FACTORS FREQUENCY                       Contractures      Additional Factors Info  Code Status, Allergies (Full Code) Code Status Info:  (Full Code) Allergies Info:  (Latex)           Current Medications (07/20/2015):  This is the current hospital active medication list Current Facility-Administered Medications  Medication Dose Route  Frequency Provider Last Rate Last Dose  . acetaminophen (TYLENOL) tablet 650 mg  650 mg Oral Q4H PRN Antony MaduraKelly Humes, PA-C   650 mg at 07/20/15 0839  . calcium-vitamin D (OSCAL WITH D) 500-200 MG-UNIT per tablet 1 tablet  1 tablet Oral Daily Lorre NickAnthony Allen, MD   1 tablet at 07/20/15 [phone]  . loratadine (CLARITIN) tablet 10 mg  10 mg Oral Daily Lorre NickAnthony Allen, MD   10 mg at 07/20/15 [id number]   Current Outpatient Prescriptions  Medication Sig Dispense Refill  . Calcium Carbonate-Vitamin D (CALCIUM-VITAMIN D) 500-200 MG-UNIT tablet Take 1 tablet by mouth daily.    . cetirizine (ZYRTEC) 10 MG tablet Take 10 mg by mouth daily.    Marland Kitchen. escitalopram (LEXAPRO) 20 MG tablet Take 20 mg by mouth daily. Reported on 07/16/2015    . ferrous sulfate 325 (65 FE) MG EC tablet Take 325 mg by mouth daily with breakfast.    . MAGNESIUM SULFATE PO Take 1 tablet by mouth daily.       Discharge Medications: Please see discharge summary for a list of discharge medications.  Relevant Imaging Results:  Relevant Lab Results:   Additional Information    Claudean SeveranceLaVonia M Dominick Morella, LCSW
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
## 2015-07-21 NOTE — ED Notes (Signed)
Pt on phone with mother. 

## 2015-07-21 NOTE — Progress Notes (Signed)
PT Cancellation Note  Patient Details Name: Karen Hart MRN: [id number] DOB: 01/14/1996   Cancelled Treatment:    Reason Eval/Treat Not Completed: Pain limiting ability to participate ([facility]/o R leg pain. Ice pack on thigh. will check back tomorrow to ambualte.)   Rada HayHill, Pacey Willadsen Elizabeth 07/21/2015, 1:54 PM [name] PT [phone]
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
## 2015-07-23 NOTE — Progress Notes (Signed)
Physical Therapy Treatment Patient Details Name: Karen Hart MRN: [id number] DOB: 01/17/1996 Today's Date: 07/23/2015    History of Present Illness Pt with history of cerebellar ataxia presents with ataxic movements and generalized weakness from decreased mobility in recent month.     PT Comments    Assisted pt OOB to amb a limited distance.  Used b platform EVA walker this session due to only one assist.  Heavy use B UE's on elbow pads of walker with tendency to push walker too far to front.  Poor initiation of steppage, ataxic, festination.  Severe anterior lean.  HIGH FALL RISK.  Pt required nearly 12 min to complete 18 feet.  B LE fatigue set in and pt needed to sit.  Wheelchair brought to pt from behind.  Performed wheelchair mobility the remaining 18 feet with assist to guide around obstacles and thru doorways.  Returned to room then assisted to bathroom by NT.   Follow Up Recommendations  Home health PT     Equipment Recommendations  Wheelchair (measurements PT);Wheelchair cushion (measurements PT)    Recommendations for Other Services       Precautions / Restrictions Precautions Precautions: Fall Precaution Comments: Pt is fall risk due to ataxia     Mobility  Bed Mobility Overal bed mobility: Modified Independent             General bed mobility comments: needs increased time due to ataxic mvts and use of rails.  Transfers Overall transfer level: Needs assistance Equipment used: None Transfers: Sit to/from Stand Sit to Stand: Supervision;Min guard         General transfer comment: very close guarding for safety due to ataxia. Did not need any hands on assistance.  heavy use UE's to support self.  Ambulation/Gait Ambulation/Gait assistance: Mod assist Ambulation Distance (Feet): 18 Feet   Gait Pattern/deviations: Decreased step length - right;Decreased step length - left;Ataxic;Festinating     General Gait Details: used B platform EVA walker this  session due to 2nd assist not available.  Heavy use B UE's on walker pads and tendancy to push walker too far to front.  Great difficulty initiating steps and heavy tendency to lock knees into extension for support.  Multiple standing breaks due to distraction and difficulty initiation.  75% VC's to "pick feet up".  Poor forward lean.  HIGH FALL RISK.  Limited amb distance due to multiple standing breaks and increased time (nearly 12 min) to advance to 18 feet.  Towards end, pt showing signs of muscle fatigue, so wheelchair brought to pt from behind.     Stairs            Merchant navy officerWheelchair Mobility Wheelchair Mobility Wheelchair mobility: Yes Wheelchair propulsion: Both upper extremities;Both lower extermities Wheelchair parts: Needs assistance Distance: 35 feet Wheelchair Assistance Details (indicate cue type and reason): difficulty using B UE's and B LE's at same time.  More use B LE but required assist around obsticles and safe thru doorways.    Modified Rankin (Stroke Patients Only)       Balance                                    Cognition Arousal/Alertness: Awake/alert Behavior During Therapy: WFL for tasks assessed/performed Overall Cognitive Status: History of cognitive impairments - at baseline  Exercises      General Comments        Pertinent Vitals/Pain Pain Assessment: No/denies pain    Home Living                      Prior Function            PT Goals (current goals can now be found in the care plan section) Progress towards PT goals: Progressing toward goals    Frequency  Min 2X/week    PT Plan Current plan remains appropriate    Co-evaluation             End of Session Equipment Utilized During Treatment: Gait belt Activity Tolerance: Patient tolerated treatment well Patient left: in chair;with call bell/phone within reach;with nursing/sitter in room     Time: 9604-54091408-1435 PT Time  Calculation (min) (ACUTE ONLY): 27 min  Charges:  $Gait Training: 8-22 mins $Wheel Chair Management: 8-22 mins                    G Codes:      Felecia ShellingLori Hampton Cost  PTA [facility]  Acute  Rehab Pager      [phone]

## 2015-07-23 NOTE — Progress Notes (Signed)
CSW met with patient at bedside. Patient was alert and oriented and appeared to be in a good mood.  CSW checked the patient's chart to check on possible ALF placements. Thus far, request have been sent to various facilities and the patient is pending an answer from 47 facilities. The patient has been declined at 3 facilities including [facility], [facility], and [facility].  CSW will continue to follow up and search for other placement.  Willette Brace [phone] ED CSW 07/23/2015 10:47 PM

## 2015-07-23 NOTE — Progress Notes (Signed)
CSW arranged for group home owner of [facility]/Ms.Elly Modena to meet with patient. Patient and owner met wt briefly at bedside to determine if patient would be appropriate for her facility.  Owner states that she has been declined to her facility due to acuity.   Tilda Burrow, [place] ED CSW

## 2015-07-23 NOTE — ED Notes (Signed)
Patient called out wanting to have her left elbow dressing changed. Pt has a healing abrasion with a band-aid over the site. Using clean technique, removed old band-aid. Cleansed wound with wound cleaner and applied a new band-aid to the site. Pt tolerated it well.

## 2015-07-23 NOTE — ED Notes (Signed)
PT at the bedside.
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
## 2015-07-24 NOTE — Progress Notes (Signed)
Occupational Therapy Treatment Patient Details Name: Karen Hart How MRN: [id number] DOB: 10/10/1995 Today's Date: 07/24/2015    History of present illness Pt with history of cerebellar ataxia presents with ataxic movements and generalized weakness from decreased mobility in recent month.    OT comments  Pt tearful this session.  Continues to need min guard for standing and transfers due to ataxic movement.    Follow Up Recommendations  Home health OT;SNF;Supervision/Assistance - 24 hour    Equipment Recommendations  3 in 1 bedside comode;Wheelchair (measurements OT);Tub/shower bench    Recommendations for Other Services      Precautions / Restrictions Precautions Precautions: Fall Precaution Comments: Pt is fall risk due to ataxia        Mobility Bed Mobility Overal bed mobility:  (supervision)             General bed mobility comments: needs increased time due to ataxic mvts and use of rails.  Transfers Overall transfer level: Needs assistance Equipment used:  (w/c)   Sit to Stand: Min guard Stand pivot transfers: Min guard       General transfer comment: very close guarding for safety due to ataxia. Did not need any hands on assistance.  heavy use UE's to support self.    Balance                                   ADL                           Toilet Transfer: Min guard;Stand-pivot;BSC;Grab bars (from w/c)   Toileting- Clothing Manipulation and Hygiene: Minimal assistance;Sit to/from stand         General ADL Comments: Performed transfer from w/c with heavy reliance on grab bars and rails on commode and w/c.  Therapist had to stabilize both w/c and 3:1 due to her heavy reliance on these.  Performed exercises below.  Pt tearful this session about not going home.      Vision                     Perception     Praxis      Cognition   Behavior During Therapy: WFL for tasks assessed/performed Overall Cognitive Status:  History of cognitive impairments - at baseline                       Extremity/Trunk Assessment               Exercises Other Exercises Other Exercises: theraband, bil for biceps Other Exercises: A/AAROM to bil shoulders.  Pt had difficulty maintaining straight arm--tended to recruit biceps   Shoulder Instructions       General Comments      Pertinent Vitals/ Pain       Pain Assessment: No/denies pain  Home Living                                          Prior Functioning/Environment              Frequency Min 2X/week     Progress Toward Goals  OT Goals(current goals can now be found in the care plan section)  Progress towards OT goals: Progressing toward goals     Plan  Co-evaluation                 End of Session     Activity Tolerance Patient tolerated treatment well   Patient Left in bed;with call bell/phone within reach   Nurse Communication          Time: [id number] OT Time Calculation (min): 32 min  Charges: OT General Charges $OT Visit: 1 Procedure OT Treatments $Self Care/Home Management : 8-22 mins $Therapeutic Exercise: 8-22 mins  Shaunna Rosetti 07/24/2015, 3:14 PM   Marica OtterMaryellen Barba Solt, OTR/L [phone] 07/24/2015
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
## 2015-07-25 NOTE — ED Notes (Signed)
PT at bedside.

## 2015-07-25 NOTE — Progress Notes (Signed)
CSW refaxed the patient to all the facilities she has been sent to again due to there being a change of insurance information. CSW counted 52 facilities.  Trish MageBrittney Zia Najera, LCSWA [phone] ED CSW 07/25/2015 5:49 PM

## 2015-07-25 NOTE — Progress Notes (Signed)
CSW attempted to create a new PASRR for patient using [place] [employer]. However, the it will not allow CSW to creat one. The screen states "A screen has been saved/submitted by Organization for this applicant and we can not process multiple screenings simultaneoulsy. From Applicant Lookup, search for applicant for more details".  CSW will reach back out to [place] must to inform them of issue.  Trish MageBrittney Bettylee Feig, LCSWA [phone] ED CSW 07/25/2015 2:35 PM

## 2015-07-25 NOTE — Progress Notes (Signed)
CSW spoke with Tresa EndoKelly of [place] [employer] and informed her that CSW attempted to get a new PASRR number for patient and a message came on the screen that would not allow the CSW to do so. Tresa EndoKelly states that she has sent a message in to their business analysis asking them to delete the current screen/PASRR the patient currently has in order for CSW to assist with creating her a one one.  CSW will continue to follow up.  Trish MageBrittney Tino Ronan, LCSWA [phone] ED CSW 07/25/2015 2:43 PM
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
## 2015-07-25 NOTE — Progress Notes (Signed)
CSW reached out to [facility] to inquire about PASRR for patient CSW not being able to obtain one. Staff informed CSW that patient's named is spelled another way in the chart " Karen Hart" is the spelling that they have. [place] MUST also has a medicaid number listed as [id number] O that sister previously gave CSW and had registration run that was found invalid. CSW has requested that registration run the medicaid number again using the spelling of the patient's name that [place] [facility] has listed in their chart. Registration state that they will follow back up with CSW with status of Medicaid number and if it is valid or not.  Staff states the patient's PASRR number was [id number] [phone], but she will need a new one due to her leaving from her past facility and going home. CSW is working on obtaining a new PASRR number for patient.    Karen MageBrittney Shamecca Hart, LCSWA [phone] ED CSW 07/25/2015 2:11 PM

## 2015-07-25 NOTE — Progress Notes (Signed)
CSW received voicemail from Registration who states that the Medicaid number for patient works for patient with the other spelling of her name.  CSW asked registration to correct the spelling of the patient's name in the chart due to patient confirming that her name is spelled " Karen Hart".  Crista CurbBrittney Jalie Eiland, [place] [phone] ED CSW

## 2015-07-25 NOTE — Progress Notes (Signed)
Physical Therapy Treatment Patient Details Name: Karen Hart MRN: [id number] DOB: 02/10/1995 Today's Date: 07/25/2015    History of Present Illness Pt with history of cerebellar ataxia presents with ataxic movements and generalized weakness from decreased mobility in recent month.     PT Comments    Assisted with amb using pt's personal 4 WW + 2 assist for safety such that recliner was following.  Very unsteady ataxic gait.  Great difficulty initiating steps and following through.    Follow Up Recommendations  Home health PT     Equipment Recommendations       Recommendations for Other Services       Precautions / Restrictions Precautions Precautions: Fall Precaution Comments: Pt is fall risk due to ataxia     Mobility  Bed Mobility               General bed mobility comments: OOB in recliner  Transfers Overall transfer level: Needs assistance Equipment used: 4-wheeled walker Transfers: Sit to/from Stand Sit to Stand: Min guard;Min assist         General transfer comment: very close guarding for safety due to ataxia. Did not need any hands on assistance.  heavy use UE's to support self.  Ambulation/Gait Ambulation/Gait assistance: +2 safety/equipment;Mod assist Ambulation Distance (Feet): 22 Feet Assistive device: 4-wheeled walker Gait Pattern/deviations: Step-to pattern;Decreased step length - right;Decreased step length - left Gait velocity: decreased   General Gait Details: used 4 WW + 2 assist for safety such that recliner was following.  Very limited distance.  very unsteady.  Great difficulty initiation/steppage.  Very Ataxic.  HIGH FALL RISK.    Stairs            Wheelchair Mobility    Modified Rankin (Stroke Patients Only)       Balance                                    Cognition Arousal/Alertness: Awake/alert Behavior During Therapy: WFL for tasks assessed/performed Overall Cognitive Status: History of cognitive  impairments - at baseline (child like)                      Exercises      General Comments        Pertinent Vitals/Pain Pain Assessment: No/denies pain    Home Living                      Prior Function            PT Goals (current goals can now be found in the care plan section) Progress towards PT goals: Progressing toward goals    Frequency  Min 2X/week    PT Plan Current plan remains appropriate    Co-evaluation             End of Session Equipment Utilized During Treatment: Gait belt Activity Tolerance: Patient tolerated treatment well Patient left: in chair;with call bell/phone within reach;with nursing/sitter in room     Time: 1525-1549 PT Time Calculation (min) (ACUTE ONLY): 24 min  Charges:  $Gait Training: 8-22 mins $Therapeutic Activity: 8-22 mins                    G Codes:      Felecia ShellingLori Jeven Topper  PTA [facility]  Acute  Rehab Pager      [phone]

## 2015-07-25 NOTE — Progress Notes (Addendum)
Pt is awake reading her bible this am. She requested a sprite to settle her stomach. Pt remains pleasant and cooperative. Pt ate 100% of her breakfast. She continues to utilize the hand grippers that OT supplied her to feed herself. Pt s/p shower with assistance. Tolerated well. (11am ) Pt has been sitting up watching  TV. (2pm)Report to oncoming shift.

## 2015-07-25 NOTE — Progress Notes (Signed)
CSW spoke with  Jinny SandersBetty Davis of [facility]. She states that she has a female bed open and will come to meet with patient tomorrow for a reassessment.  Trish MageBrittney Savyon Loken, LCSWA [phone] ED CSW 07/25/2015 5:44 PM
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
## 2015-07-26 NOTE — Progress Notes (Signed)
Occupational Therapy Treatment Patient Details Name: Karen Hart MRN: [id number] DOB: 03/13/1995 Today's Date: 07/26/2015    History of present illness Pt with history of cerebellar ataxia presents with ataxic movements and generalized weakness from decreased mobility in recent month.    OT comments  Pt needed a little more assistance with SPTs today due to fatique.  Goals up dated this session  Follow Up Recommendations  Home health OT;SNF;Supervision/Assistance - 24 hour    Equipment Recommendations  3 in 1 bedside comode;Wheelchair (measurements OT);Tub/shower bench    Recommendations for Other Services      Precautions / Restrictions Precautions Precautions: Fall Precaution Comments: Pt is fall risk due to ataxia        Mobility Bed Mobility               General bed mobility comments: min guard for back to bed due to ataxic movements  Transfers     Transfers: Sit to/from Stand   Stand pivot transfers: Min assist       General transfer comment: pt fatiqued and needed steadying assistance    Balance                                   ADL           Upper Body Bathing: Set up;Sitting;With adaptive equipment   Lower Body Bathing: Minimal assistance;Sit to/from stand;With adaptive equipment           Toilet Transfer: Minimal assistance;Stand-pivot;RW (w/c)             General ADL Comments: pt took a shower earlier, but shower stall is so small that she cannot use wash mitt without risk of bumping arms. Simulated in chair.  Pt was able to cross legs but c/o R thigh hurting.  Pt donned and doffed wash mitt herself.  Transferred into w/c then into bed.  Min A needed to steady as she was tired.  Pt wanted to roll w/c in hall and did so with supervision and cues as she was looking around at times.  Pt has been able to apply foam to utensils herself and opened container of cookies herself.        Vision                      Perception     Praxis      Cognition   Behavior During Therapy: WFL for tasks assessed/performed Overall Cognitive Status: History of cognitive impairments - at baseline                       Extremity/Trunk Assessment               Exercises     Shoulder Instructions       General Comments      Pertinent Vitals/ Pain       Pain Assessment: Faces Faces Pain Scale: Hurts little more Pain Location: R thigh Pain Intervention(s): Limited activity within patient's tolerance;Monitored during session;Repositioned  Home Living                                          Prior Functioning/Environment              Frequency  Progress Toward Goals  OT Goals(current goals can now be found in the care plan section)  Progress towards OT goals:  (goals updated)  Acute Rehab OT Goals Time For Goal Achievement: 08/09/15 Potential to Achieve Goals: Good ADL Goals Pt Will Perform Eating:  (discontinue) Pt Will Perform Upper Body Bathing:  (discontinue) Pt Will Perform Lower Body Bathing:  (discontinue) Pt Will Perform Upper Body Dressing: with set-up;sitting Pt Will Perform Lower Body Dressing: with min guard assist;sit to/from stand Pt Will Transfer to Toilet:  (discontinue) Pt Will Perform Toileting - Clothing Manipulation and hygiene:  (discontinue) Pt/caregiver will Perform Home Exercise Program:  (discontinue) Additional ADL Goal #1: pt will propel herself in w/c and gather adl items with supervision  Plan      Co-evaluation                 End of Session     Activity Tolerance Patient tolerated treatment well   Patient Left in bed;with call bell/phone within reach   Nurse Communication          Time: [id number] OT Time Calculation (min): 27 min  Charges: OT General Charges $OT Visit: 1 Procedure OT Treatments $Self Care/Home Management : 8-22 mins $Therapeutic Activity: 8-22  mins  Antoinne Spadaccini 07/26/2015, 3:36 PM Marica OtterMaryellen Jermichael [place]mares, OTR/L [phone] 07/26/2015

## 2015-07-26 NOTE — Progress Notes (Signed)
Pt discussed in SAPPU progression meeting: SW continue to assist with placement Discussed at Quality Collaborative Meeting Recommendations: Try [facility], [facility] and other [facility] facilities pt sent to by SW for placement now that pt with correct spelling of name and medicaid coverage Follow up on APS ED SW updated

## 2015-07-26 NOTE — Progress Notes (Signed)
ED CM meet with Ms Karen Hart, ED SW, pt and ED NT, Kizzy to review services and dme NT and CM demonstrated to Ms Karen Hart how pt's rollator collapses Pt was also providing instructions for how to fold/collapse the rollator for decrease in size and easy transport.  CM discussed PT/OT DME recommendations with pt She is okay with DME Ms Karen Hart also states she has taken pt to Dr Concepcion ElkAvbuere and Dr Julio Sickssei Bonsu Ms Karen Hart indicated to pt that she is very optimistic about pt being able to manage well at facility   Pcp appt will be completed when office pcp offices re open in am prior to d/c

## 2015-07-26 NOTE — Progress Notes (Addendum)
Karen Hart met with CSW and discussed patient and her needs. She informed CSW that she will accept patient. CSW spoke with nurse case manager who ordered home health  for patient and will set the patient up with a physician.  CSW provided supportive counseling for patient and encouragement. Nurse tech was very helpful at bedside with explaining how patient has been able to conduct her ADL'S and daily demeanor. Nurse tech and Nurse CM provided education on how to fold patient's walker.  Karen Hart/owner states that she will come to [facility] tomorrow to pick the patient up between the hours of 6:00pm and 7:30pm. She states that the patient will be going to her facility " [facility]" located at [address] [place], [place] [address].  Willette Brace [phone] ED CSW 07/26/2015 7:27 PM

## 2015-07-26 NOTE — ED Notes (Signed)
Karen Hart with Adult protective services called for f/u and update on pt , requesting to speak with Social worker.

## 2015-07-26 NOTE — Progress Notes (Signed)
  Patient suffers from history of cerebellar ataxia presents with ataxic movements and generalized weakness from decreased mobility in recent month which impairs her ability to perform daily activities like walking long distances, bathing and dressing, etc in the home.  A cane, crutch nor walker will not resolve  issue with performing activities of daily living that require extensive periods of activity. A wheelchair will allow patient to safely perform daily activities. Patient is not able to propel themselves in the home using a standard weight wheelchair due to cerebellar ataxia movements and generalized weakness. Patient can self propel in the lightweight wheelchair.  Accessories: elevating leg rests (ELRs), wheel locks, extensions and anti-tippers. Height 5'5" and weight 158 pounds

## 2015-07-26 NOTE — Progress Notes (Signed)
Owner/Betty Earlene Plateravis of [facility] reached out to CSW and states that she is waiting on for a hot water heater and that she still plans to visit the patient today.  Trish MageBrittney Keelee Yankey, LCSWA [phone] ED CSW 07/26/2015 2:38 PM

## 2015-07-26 NOTE — ED Notes (Signed)
Social worker Steward DroneBrenda  from [facility] called requesting more detailed information about pt , gave  fax number [phone], . States she is part of [facility]

## 2015-07-26 NOTE — Progress Notes (Signed)
CSW reached out to Jinny SandersBetty Davis of [facility] to inquire about her coming to reassess the patient today. However, there was no answer. CSW will continue to follow up.  Trish MageBrittney Raylynn Hersh, LCSWA [phone] ED CSW 07/26/2015 2:12 PM

## 2015-07-26 NOTE — Progress Notes (Signed)
CSW reached out to the following group homes to possibly obtain bed for patient:  [facility] - Staff states they are not accepting new residents due to facility being at capacity.  [facility] - Staff informed CSW to faxFL2 information to [phone] in order for owner to review patient.  [facility]- CSW spoke with supervisor who states that patient has been declined due to age.  Trish MageBrittney Brunilda Eble, LCSWA [phone] ED CSW 07/26/2015 3:25 PM

## 2015-07-26 NOTE — ED Notes (Signed)
Pt taken to shower and assisted with bathing, hair washed. Pt sitting in recliner eating lunch.
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
## 2015-07-26 NOTE — ED Notes (Signed)
Message left for caseworker KIM

## 2015-07-26 NOTE — Progress Notes (Addendum)
ED CM faxed clinicals to Steward DroneBrenda at [facility] per request to fax [phone] with fax confirmation received Clinicals to include Facesheet, EDP noted BH MD note, PT/OT notes and MAR Cm was informed Steward DroneBrenda had a copy of FL2 already

## 2015-07-26 NOTE — Progress Notes (Signed)
Pt insurance coverage changed from uninsured to medicaid of [place] CM placed a list of [place] providers in pt belonging bag CM changed d/c instructions to  Please use the list of [place] providers to find a doctor for follow up services  [place]: [phone] [address] [place], [place] [address] CommodityPost.eshttps://dma.ncdhhs.gov/ Use this website to assist with understanding your coverage & to renew application As a Medicaid client you MUST contact DSS/SSI each time you change address, move to another [place] or another state to keep your address updated  [place] Transportation to Dr appts if you are have full Medicaid: [phone], [phone]

## 2015-07-27 MED ORDER — ACETAMINOPHEN 325 MG PO TABS: 650.0000 mg | ORAL_TABLET | ORAL | Status: AC | PRN

## 2015-07-27 MED ORDER — CALCIUM-VITAMIN D 500-200 MG-UNIT PO TABS: 1.0000 | ORAL_TABLET | Freq: Every day | ORAL | Status: DC

## 2015-07-27 MED ORDER — FERROUS SULFATE 325 (65 FE) MG PO TBEC: 325.0000 mg | DELAYED_RELEASE_TABLET | Freq: Every day | ORAL | Status: DC

## 2015-07-27 MED ORDER — CETIRIZINE HCL 10 MG PO TABS: 10.0000 mg | ORAL_TABLET | Freq: Every day | ORAL | Status: DC

## 2015-07-27 NOTE — Progress Notes (Addendum)
CSW was notified by nurse that APS was here to speak with CSW.  CSW met with APS worker [employer] in conference room and her co-worker. She  CSW informed APS worker that patient was accepted to a facility yesterday and she is expected to leave for th facility tomorrow. APS worker requested information regarding why the patient presented to [facility], and medications and also states she wants discharge paper work when patient leaves.  CSW provided APS worker with they psychiatrist note, and medication list.  Crystal White/APS [phone]  Willette Brace [phone] ED CSW 07/27/2015 12:24 PM

## 2015-07-27 NOTE — Progress Notes (Signed)
PT Cancellation Note  Patient Details Name: Karen Hart MRN: [id number] DOB: 03/04/1995   Cancelled Treatment:     Pt out of room with NT "going to get some ice cream".  Per chart review, pt is D/C around 6pm tonight.     Armando ReichertKropski, Jomel Whittlesey Ann 07/27/2015, 2:05 PM
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
## 2015-07-27 NOTE — Progress Notes (Signed)
CSW and Nurse CM reached out to guardian. CSW informed guardian that patient has been accepted into her group home. CSW provided patient with information regarding group home and CM provided her information with patient's upcoming scheduled appointment.  Guardian states that patient disability check has been cut off, and that she is scheduled to get a Survivors check in July. Guardian states patient will receive a survivors check due to step dad passing away.  Guardian informed CSW and Nurse CM that she plans to give up her rights as guardian in order for her to get a state appointed guardian.  Trish MageBrittney Arrie Zuercher, LCSWA [phone] ED CSW 07/27/2015 4:03 PM

## 2015-07-27 NOTE — Progress Notes (Signed)
CSW made psychiatrist aware that patient will need scripts for her medications upon discharge which is expected at 6:00pm. He made NP aware that scripts will be needed.  Trish MageBrittney Saylee Sherrill, LCSWA [phone] ED CSW 07/27/2015 1:41 PM

## 2015-07-27 NOTE — Progress Notes (Signed)
CSW placed signed FL2 and scripts in the patient's chart.  Trish MageBrittney Jenavive Lamboy, LCSWA [phone] ED CSW 07/27/2015 5:36 PM

## 2015-07-27 NOTE — Progress Notes (Addendum)
1130 spoke with Karren Burlyhandra at [facility] and new medicaid pt appt available second week of August Placed [place] fire dept on pt TCU chart to be filed or scanned 1150 attempted call to general medical to see if earlier new pt appt no answer [phone] -Unable to leave a message at [phone] Spoke with elizabeth [phone] at [facility] who will consult her office manager for new medicaid patients and return a call to CM if available July appts 1203 spoke with mable at Dr Amada KingfisherGeorge osei Bonsu office [phone] to get pt an appt for August 07 2015 at 0915 to be seen as new medicaid pt at the [address] [place] location This was entered in d/c instructions in EPIC:   medicaid of [place] covered patient [place]: [phone] [address] [place], [place] [address] CommodityPost.eshttps://dma.ncdhhs.gov/ Use this website to assist with understanding your coverage & to renew application As a Medicaid client you MUST contact DSS/SSI each time you change address, move to another  county or another state to keep your address updated  [place] Medicaid Transportation to Dr appts if you are have full Medicaid: [phone], [phone]  Jackie PlumOsei-Bonsu, George Go on 08/07/2015 You have been scheduled an appointment for Tuesday August 07 2015 at 0915 with Dr Amada KingfisherGeorge osei Bonsu to establish care PLEASE TAKE YOUR PHOTO ID AND MEDICAID CARD [address] [place] [address] [phone]  [facility] This is the home health agency available to provide you with home health nurse, social worker , 3n1, tub bench and light weight wheelchair Call if you have any further questions or concerns in the future [address] [place] [address] [phone]   Pt informed ED CM her sister has her photo ID, medicaid card "in a plastic bag at her house" Pt also would like sister to provider her with her "dark shoes with flowers on them"    1213 Spoke with Jasmine at  DSS [phone] to get assist to place Dr Amada Kingfishergeorge osei bonsu on medicaid data base as new medicaid pcp Jasmine offered assigned DSS case worker at [facility] at [phone] Cm was transferred to Ms Manson PasseyBrown and Cm left a voice message stating Amada KingfisherGeorge Osei Bonsu  Needed to be added as pt new medicaid pcp before going to a August 07 2015 appt Cm left Cm mobile number for questions and left pt Medicaid subscriber number as directed by Leavy CellaJasmine   1239 Called to updated elizabeth at [facility] and latitia at dr Concepcion Elkavbuere office (who had offered first available medicaid appt on 09/12/15 at 0930-Cm cancelled appt)

## 2015-07-27 NOTE — Progress Notes (Signed)
CSW spoke with [name] staff who states that she has deleted the existing PASRR number in order for CSW to create a new one.  CSW completed PASRR information on [name] site. The site state that the PASRR is under manual review and that the screen for PASRR is still running. A nurse will review the screen. CSW will follow up to check the status of PASRR.  Trish MageBrittney Caitlyne Hart, LCSWA [phone] ED CSW 07/27/2015 2:38 PM

## 2015-07-27 NOTE — Progress Notes (Signed)
CSW attempted to create a new PASRR number for patient. However, the system still will not allow patient to obtain PASRR. CSW will reach out to [place] Must to inquire if their IT/business analysis ever fixed the problem that was reported earlier this week.  Trish MageBrittney Lively Haberman, LCSWA [phone] ED CSW 07/27/2015 1:55 PM

## 2015-07-27 NOTE — Progress Notes (Signed)
Per EDRN, patient has been discharged.  Patient's equipment, 3 in 1, walker, wheelchair and shower seat could not fit in [employer] Hart's staff member's car per [facility].  She reports staff members will be back tomorrow to pick up her equipment.  Patient name labels placed on equipment per EDRN.

## 2015-07-27 NOTE — Progress Notes (Addendum)
ED CM spoke with Clydie BraunKaren of [facility] about services and DME for pt  [facility] to [facility]" located at [address], [place], [place] [address]- [facility] Davis [phone] later in the evening [facility] staff is aware of DME - ED RN aware

## 2015-07-27 NOTE — [place] FL2 (Signed)
  [place] MEDICAID FL2 LEVEL OF CARE SCREENING TOOL     IDENTIFICATION  Patient Name: Karen Hart Birthdate: 09/10/1995 Sex: female Admission Date (Current Location): 07/13/2015  [facility] [place]  [place]   Facility and Address:  [facility],  [address], [place] [address]      Provider Number: [id number]  Attending Physician Name and Address:  Provider Default, MD  Relative Name and Phone Number:       Current Level of Care: Hospital Recommended Level of Care: Family Care Home Prior Approval Number:    Date Approved/Denied:   PASRR Number:    Discharge Plan: Other (Comment) (Family Care Home)    Current Diagnoses: Patient Active Problem List   Diagnosis Date Noted  . 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    Orientation RESPIRATION BLADDER Height & Weight     Self  Normal Continent Weight:   Height:     BEHAVIORAL SYMPTOMS/MOOD NEUROLOGICAL BOWEL NUTRITION STATUS      Continent Diet  AMBULATORY STATUS COMMUNICATION OF NEEDS Skin   Limited Assist (Per Nurse, needs stand by short distances, has the four wheel walker) Verbally Normal                       Personal Care Assistance Level of Assistance  Bathing, Feeding, Dressing Bathing Assistance: Limited assistance Feeding assistance: Independent (Per Nurse, can feed self after set-up) Dressing Assistance: Limited assistance     Functional Limitations Info  Sight, Hearing, Speech Sight Info: Adequate (Patient wears glasses) Hearing Info: Adequate Speech Info: Adequate    SPECIAL CARE FACTORS FREQUENCY                       Contractures      Additional Factors Info  Code Status, Allergies (Full Code) Code Status Info:  (Full Code) Allergies Info:  (Latex)           Current Medications (07/27/2015):  This is the current hospital active medication list Current Facility-Administered Medications  Medication Dose  Route Frequency Provider Last Rate Last Dose  . acetaminophen (TYLENOL) tablet 650 mg  650 mg Oral Q4H PRN Antony MaduraKelly Humes, PA-C   650 mg at 07/27/15 1110  . calcium-vitamin D (OSCAL WITH D) 500-200 MG-UNIT per tablet 1 tablet  1 tablet Oral Daily Lorre NickAnthony Allen, MD   1 tablet at 07/27/15 1026  . loratadine (CLARITIN) tablet 10 mg  10 mg Oral Daily Lorre NickAnthony Allen, MD   10 mg at 07/27/15 1026   Current Outpatient Prescriptions  Medication Sig Dispense Refill  . Calcium Carbonate-Vitamin D (CALCIUM-VITAMIN D) 500-200 MG-UNIT tablet Take 1 tablet by mouth daily.    . cetirizine (ZYRTEC) 10 MG tablet Take 10 mg by mouth daily.    Marland Kitchen. escitalopram (LEXAPRO) 20 MG tablet Take 20 mg by mouth daily. Reported on 07/16/2015    . ferrous sulfate 325 (65 FE) MG EC tablet Take 325 mg by mouth daily with breakfast.    . MAGNESIUM SULFATE PO Take 1 tablet by mouth daily.       Discharge Medications: Please see discharge summary for a list of discharge medications.  Relevant Imaging Results:  Relevant Lab Results:   Additional Information    Manual Navarra R, LCSW

## 2015-07-27 NOTE — ED Notes (Signed)
[facility]. Pink walker, black wheelchair, shower seat, and toilet assist left behind due to it not fitting in staff members car. These belongings placed in the equipment room in TCU, patient stickers placed on items. [phone] is number to call if someone needs to be reached to come get stuff, someone is suppose to come back tomorrow to get it.

## 2015-07-27 NOTE — Progress Notes (Signed)
Updated ED SW  Updated [facility] staff, Clydie BraunKaren with pcp name  Entered in d/c instructions [place] [employer] DSS case worker assigned to you Call As needed [facility] Brown [address]  [place] [phone]

## 2015-07-27 NOTE — Progress Notes (Signed)
CSW spoke with Nurse CM who informed her that patient has been set up with [facility] and that the patient will have a Dealersocial worker and nurse. Also, she states that she set up an appointment for patient on July 11 at 9:15 with Dr.George Osei-Bonsu.  Nurse CM informed CSW that patient has a Child psychotherapistsocial worker with DSS named Tracie HarrierGeorgetta Brown [phone]  Trish MageBrittney Sareena Odeh, LCSWA [phone] ED CSW 07/27/2015 1:39 PM

## 2015-07-27 NOTE — Progress Notes (Signed)
CSW reached out to group home owner and informed her to inquire if she was on her way to pick up. However, she did not answer the phone. CSW left a message informing her that the patient will be ready and that all things she requested are in the chart.  Trish MageBrittney Lorraine Terriquez, LCSWA [phone] ED CSW 07/27/2015 6:46 PM

## 2015-07-27 NOTE — Discharge Instructions (Signed)
  Adjustment Disorder Adjustment disorder is an unusually severe reaction to a stressful life event, such as the loss of a job or physical illness. The event may be any stressful event other than the loss of a loved one. Adjustment disorder may affect your feelings, your thinking, how you act, or a combination of these. It may interfere with personal relationships or with the way you are at work, school, or home. People with this disorder are at risk for suicide and substance abuse. They may develop a more serious mental disorder, such as major depressive disorder or post-traumatic stress disorder. SIGNS AND SYMPTOMS  Symptoms may include:  Sadness, depressed mood, or crying spells.  Loss of enjoyment.  Change in appetite or weight.  Sense of loss or hopelessness.  Thoughts of suicide.  Anxiety, worry, or nervousness.  Trouble sleeping.  Avoiding family and friends.  Poor school performance.  Fighting or vandalism.  Reckless driving.  Skipping school.  Poor work performance.  Ignoring bills. Symptoms of adjustment disorder start within 3 months of the stressful life event. They do not last more than 6 months after the event has ended. DIAGNOSIS  To make a diagnosis, your health care provider will ask about what has happened in your life and how it has affected you. He or she may also ask about your medical history and use of medicines, alcohol, and other substances. Your health care provider may do a physical exam and order lab tests or other studies. You may be referred to a mental health specialist for evaluation. TREATMENT  Treatment options include:  Counseling or talk therapy. Talk therapy is usually provided by mental health specialists.  Medicine. Certain medicines may help with depression, anxiety, and sleep.  Support groups. Support groups offer emotional support, advice, and guidance. They are made up of people who have had similar experiences. HOME CARE  INSTRUCTIONS  Keep all follow-up visits as directed by your health care provider. This is important.  Take medicines only as directed by your health care provider. SEEK MEDICAL CARE IF:  Your symptoms get worse.  SEEK IMMEDIATE MEDICAL CARE IF: You have serious thoughts about hurting yourself or someone else. MAKE SURE YOU:  Understand these instructions.  Will watch your condition.  Will get help right away if you are not doing well or get worse.   This information is not intended to replace advice given to you by your health care provider. Make sure you discuss any questions you have with your health care provider.   Document Released: 09/17/2005 Document Revised: 02/03/2014 Document Reviewed: 06/07/2013 Elsevier Interactive Patient Education 2016 Elsevier Inc.  

## 2015-07-27 NOTE — Progress Notes (Signed)
ED CM spoke with pt to update her that CM made her a new medicaid pcp appt, that ED CM and ED SW spoke with her sister who agrees to assist to provider Ms Earlene PlaterDavis with pt medicaid and Photo ID cards prior to her pcp appt on August 07 2015 0915 and informed her [place] case worker, Radiographer, therapeuticGeorgetta Brown Informed pt that all information is her d/c instructions Pt voiced understand  Pt states she likes her new w/c Cm noted pt 3n1, tub bench and light weight w/c present. CM had received a call at [phone] from [place] [facility] who stated they would be visiting pt and bringing her DME to her room in [facility] Inquired if pt was able to sign for her items and Cm informed them she would be able to sign Cm had Notified [facility] RN, Marylu LundJanet that [facility] staff would be visiting and bringing DME  Cm was present with ED SW when pt's sister was called Cm discussed with sister that pt had a new medicaid appt on August 07 2015 at 0915 with Dr Suzzette RighterGeorge Ose Bonsu CM provided [address] as address. Cm discussed pt [place] DSS case worker Tracie HarrierGeorgetta Brown and gave the contact nubmer [phone]. Cm discussed pt home health services RN, SW and DME  Sister states pt disability check was stopped and pt is now receiving a survivor check from her "step father" who died.  States she was informed it would sent out in "July but I am not sure how it will what because of the holiday"  CM inquired of sister if she is still pt legal guardian and was informed "yes" CM inquired of sister if Kathie RhodesBetty or anyone else including pt new pcp needed assist with pt care if she would be willing to assist She stated "yes I am willing to help" Then asked CM & SW "Is this thing with Kathie RhodesBetty permanent?"  CM informed Ala Dachovella pt is going to stay with Kathie RhodesBetty but there is no guarantee and nothing is permanent.  Sister state she "still want her to be a ward of the state.  I am not in this for the money."  She informed CM she "spoke with Sheralyn Boatmanoni  after speaking with you and she told be about going to the clerk of court" Cm asked if She understood or had questions about how to make the pt a ward of the state and the process Ala Dachovella stated she understood the process and did not have questions or need assist from ED CM or ED SW

## 2015-07-27 NOTE — Progress Notes (Signed)
EDCM called Karen SandersBetty Hart [phone] to inquire if she is still picking up the patient.  Ms. Karen PlaterDavis reports there is a staff member by the name of Karen Hart in the lobby waiting to pick up the patient.  EDCM informed EDRN.

## 2015-10-26 ENCOUNTER — Emergency Department (HOSPITAL_COMMUNITY): Payer: Medicaid Other

## 2015-10-26 ENCOUNTER — Encounter (HOSPITAL_COMMUNITY): Payer: Self-pay | Admitting: *Deleted

## 2015-10-26 ENCOUNTER — Emergency Department (HOSPITAL_COMMUNITY)
Admission: EM | Admit: 2015-10-26 | Discharge: 2015-10-26 | Disposition: A | Discharge status: Home / Self Care | Payer: Medicaid Other | Attending: Emergency Medicine | Admitting: Emergency Medicine

## 2015-10-26 DIAGNOSIS — R079 Chest pain, unspecified: Secondary | ICD-10-CM | POA: Insufficient documentation

## 2015-10-26 DIAGNOSIS — R51 Headache: Secondary | ICD-10-CM | POA: Insufficient documentation

## 2015-10-26 DIAGNOSIS — Z91040 Latex allergy status: Secondary | ICD-10-CM | POA: Diagnosis not present

## 2015-10-26 DIAGNOSIS — R52 Pain, unspecified: Secondary | ICD-10-CM

## 2015-10-26 DIAGNOSIS — R109 Unspecified abdominal pain: Secondary | ICD-10-CM | POA: Insufficient documentation

## 2015-10-26 LAB — URINALYSIS, ROUTINE W REFLEX MICROSCOPIC
Bilirubin Urine: NEGATIVE
GLUCOSE, UA: NEGATIVE mg/dL
Hgb urine dipstick: NEGATIVE
Ketones, ur: NEGATIVE mg/dL
LEUKOCYTES UA: NEGATIVE
NITRITE: NEGATIVE
PH: 6.5 (ref 5.0–8.0)
PROTEIN: NEGATIVE mg/dL
Specific Gravity, Urine: 1.019 (ref 1.005–1.030)

## 2015-10-26 LAB — CBC
HCT: 41.9 % (ref 36.0–46.0)
HEMOGLOBIN: 13.2 g/dL (ref 12.0–15.0)
MCH: 24.9 pg — AB (ref 26.0–34.0)
MCHC: 31.5 g/dL (ref 30.0–36.0)
MCV: 78.9 fL (ref 78.0–100.0)
PLATELETS: 291 10*3/uL (ref 150–400)
RBC: 5.31 MIL/uL — AB (ref 3.87–5.11)
RDW: 14.7 % (ref 11.5–15.5)
WBC: 5.3 10*3/uL (ref 4.0–10.5)

## 2015-10-26 LAB — BASIC METABOLIC PANEL
ANION GAP: 7 (ref 5–15)
BUN: 10 mg/dL (ref 6–20)
CALCIUM: 9.8 mg/dL (ref 8.9–10.3)
CHLORIDE: 107 mmol/L (ref 101–111)
CO2: 24 mmol/L (ref 22–32)
Creatinine, Ser: 0.74 mg/dL (ref 0.44–1.00)
GFR calc non Af Amer: >60 mL/min (ref >60)
Glucose, Bld: 78 mg/dL (ref 65–99)
POTASSIUM: 3.8 mmol/L (ref 3.5–5.1)
Sodium: 138 mmol/L (ref 135–145)

## 2015-10-26 LAB — I-STAT BETA HCG BLOOD, ED (MC, WL, AP ONLY): I-stat hCG, quantitative: <5 m[IU]/mL (ref <5)

## 2015-10-26 LAB — I-STAT TROPONIN, ED: Troponin i, poc: 0 ng/mL (ref 0.00–0.08)

## 2015-10-26 MED ORDER — FAMOTIDINE 20 MG PO TABS: 20.0000 mg | ORAL_TABLET | Freq: Every day | ORAL | 0 refills | Status: DC

## 2015-10-26 MED ORDER — ONDANSETRON 4 MG PO TBDP
4.0000 mg | ORAL_TABLET | Freq: Once | ORAL | Status: AC
  Administered 2015-10-26: 4 mg via ORAL
  Filled 2015-10-26: qty 1

## 2015-10-26 MED ORDER — IBUPROFEN 400 MG PO TABS
600.0000 mg | ORAL_TABLET | Freq: Once | ORAL | Status: AC
  Administered 2015-10-26: 600 mg via ORAL
  Filled 2015-10-26: qty 1

## 2015-10-26 MED ORDER — IBUPROFEN 600 MG PO TABS: 600.0000 mg | ORAL_TABLET | Freq: Four times a day (QID) | ORAL | 0 refills | Status: DC | PRN

## 2015-10-26 NOTE — ED Triage Notes (Signed)
Pt arrived by [facility] for pain to back, chest and abd. Hx of same and pt stays in assisted living. Ambulating with walker at triage.

## 2015-10-26 NOTE — Discharge Instructions (Addendum)
Read the information below.  Use the prescribed medication as directed.  Please discuss all new medications with your pharmacist.  You may return to the Emergency Department at any time for worsening condition or any new symptoms that concern you.    °

## 2015-10-26 NOTE — ED Provider Notes (Signed)
[facility] Provider Note   CSN: [id number] Arrival date & time: 10/26/15  1408     History   Chief Complaint Chief Complaint  Patient presents with  . Back Pain  . Chest Pain  . Abdominal Pain    HPI Karen Hart is a 20 y.o. female.  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  Pt has headaches, chest pain, back pain, and abdominal pain.  Each pain occurs regularly, usually daily, and usually at night.  They last a few hours and go away.  She currently has a headache and had chest pain while in the lobby.  The headache is all over her head.  The chest pain is stabbing, feels like reflux.  States she no longer has medication for reflux and this is why she had chest pain.  Currently denies abdominal or back pain.  Has lived in [place] for 4 months and states she does not have a primary care provider to follow up with.   Denies fevers, cough, SOB, dysuria, bowel changes, recent falls or change in activity.   Past Medical History:  Diagnosis Date  . Cerebellar ataxia [facility])     Patient Active Problem List   Diagnosis Date Noted  . 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 No pertinent surgical history.  OB History    No data available       Home Medications    Prior to Admission medications   Medication Sig Start Date End Date Taking? Authorizing Provider  acetaminophen (TYLENOL) 325 MG tablet Take 2 tablets (650 mg total) by mouth every 4 (four) hours as needed (as needed for pain). 07/27/15  Yes Beau Fanny, FNP  Calcium Carbonate-Vitamin D (CALCIUM-VITAMIN D) 500-200 MG-UNIT tablet Take 1 tablet by mouth daily. 07/27/15  Yes Beau Fanny, FNP  cetirizine (ZYRTEC) 10 MG tablet Take 1 tablet (10 mg total) by mouth daily. 07/27/15  Yes Beau Fanny, FNP  famotidine (PEPCID) 20 MG tablet Take 1 tablet (20 mg total) by mouth daily. 10/26/15    Trixie Dredge, PA-C  ferrous sulfate 325 (65 FE) MG EC tablet Take 1 tablet (325 mg total) by mouth daily with breakfast. Patient not taking: Reported on 10/26/2015 07/27/15   Beau Fanny, FNP  ibuprofen (ADVIL,MOTRIN) 600 MG tablet Take 1 tablet (600 mg total) by mouth every 6 (six) hours as needed for mild pain or moderate pain. 10/26/15   Trixie Dredge, PA-C    Family History History reviewed. No pertinent family history.  Social History Social History  Substance Use Topics  . Smoking status: Never Smoker  . Smokeless tobacco: Not on file  . Alcohol use Not on file     Allergies   Other; Lactose intolerance (gi); Latex; and Pork-derived products   Review of Systems Review of Systems  All other systems reviewed and are negative.    Physical Exam Updated Vital Signs BP 115/68   Pulse 85   Temp 97.6 F (36.4 C) (Oral)   Resp 23   Ht 5\' 5"  (1.651 m)   Wt 68 kg   SpO2 100%   BMI 24.96 kg/m   Physical Exam  Constitutional: She appears well-developed and well-nourished. No distress.  HENT:  Head: Normocephalic and atraumatic.  Eyes: EOM are normal.  Neck: Normal range of motion. Neck supple.  Cardiovascular: Normal rate and regular rhythm.  Pulmonary/Chest: Effort normal and breath sounds normal. No respiratory distress. She has no wheezes. She has no rales.  Abdominal: Soft. She exhibits no distension. There is tenderness (mild ,diffuse ). There is no rebound and no guarding.  Musculoskeletal: She exhibits no edema or deformity.  Neurological: She is alert.  Skin: She is not diaphoretic.  Nursing note and vitals reviewed.    ED Treatments / Results  Labs (all labs ordered are listed, but only abnormal results are displayed) Labs Reviewed  CBC - Abnormal; Notable for the following:       Result Value   RBC 5.31 (*)    MCH 24.9 (*)    All other components within normal limits  BASIC METABOLIC PANEL  URINALYSIS, ROUTINE W REFLEX MICROSCOPIC (NOT AT [facility])    I-STAT TROPOININ, ED  I-STAT BETA HCG BLOOD, ED ([facility], [facility], [facility] ONLY)    EKG  EKG Interpretation None       Radiology Dg Chest 2 View  Result Date: 10/26/2015 CLINICAL DATA:  Chest pain. EXAM: CHEST  2 VIEW COMPARISON:  None. FINDINGS: The heart size and mediastinal contours are within normal limits. Both lungs are clear. No pneumothorax or pleural effusion is noted. The visualized skeletal structures are unremarkable. IMPRESSION: No active cardiopulmonary disease. Electronically Signed   By: Lupita RaiderJames  Green Jr, M.D.   On: 10/26/2015 15:54    Procedures Procedures (including critical care time)  Medications Ordered in ED Medications  ibuprofen (ADVIL,MOTRIN) tablet 600 mg (600 mg Oral Given 10/26/15 1919)  ondansetron (ZOFRAN-ODT) disintegrating tablet 4 mg (4 mg Oral Given 10/26/15 1919)     Initial Impression / Assessment and Plan / ED Course  I have reviewed the triage vital signs and the nursing notes.  Pertinent labs & imaging results that were available during my care of the patient were reviewed by me and considered in my medical decision making (see chart for details).  Clinical Course   Afebrile, nontoxic patient with cerebellar ataxia presents with intermittent pain x 1 month.  Occasionally headache, sometimes chest pain, other times back or abdominal pain.  Workup and exam are reassuring.  Pt given motrin for pain, advised to follow up with PCP.  Dr Julio Sickssei Bonsu is listed as her PCP.   D/C home, encouraged close PCP follow up.  Discussed result, findings, treatment, and follow up  with patient.  Pt given return precautions.  Pt verbalizes understanding and agrees with plan.       Final Clinical Impressions(s) / ED Diagnoses   Final diagnoses:  Pain    New Prescriptions New Prescriptions   FAMOTIDINE (PEPCID) 20 MG TABLET    Take 1 tablet (20 mg total) by mouth daily.   IBUPROFEN (ADVIL,MOTRIN) 600 MG TABLET    Take 1 tablet (600 mg total) by mouth every 6 (six) hours as  needed for mild pain or moderate pain.     Trixie Dredgemily Jahna Liebert, PA-C 10/26/15 1950    Canary Brimhristopher J Tegeler, MD 10/27/15 [phone]

## 2015-11-14 ENCOUNTER — Emergency Department (HOSPITAL_COMMUNITY)
Admission: EM | Admit: 2015-11-14 | Discharge: 2015-11-14 | Disposition: A | Discharge status: Home / Self Care | Payer: Medicaid Other | Attending: Emergency Medicine | Admitting: Emergency Medicine

## 2015-11-14 ENCOUNTER — Encounter (HOSPITAL_COMMUNITY): Payer: Self-pay

## 2015-11-14 DIAGNOSIS — R079 Chest pain, unspecified: Secondary | ICD-10-CM | POA: Diagnosis present

## 2015-11-14 DIAGNOSIS — K219 Gastro-esophageal reflux disease without esophagitis: Secondary | ICD-10-CM

## 2015-11-14 DIAGNOSIS — Z91040 Latex allergy status: Secondary | ICD-10-CM | POA: Diagnosis not present

## 2015-11-14 HISTORY — DX: Gastro-esophageal reflux disease without esophagitis: K21.9

## 2015-11-14 MED ORDER — PANTOPRAZOLE SODIUM 20 MG PO TBEC: 20.0000 mg | DELAYED_RELEASE_TABLET | Freq: Every day | ORAL | 0 refills | Status: AC

## 2015-11-14 MED ORDER — SUCRALFATE 1 G PO TABS: 1.0000 g | ORAL_TABLET | Freq: Two times a day (BID) | ORAL | 0 refills | Status: AC

## 2015-11-14 MED ORDER — GI COCKTAIL ~~LOC~~
30.0000 mL | Freq: Once | ORAL | Status: AC
  Administered 2015-11-14: 30 mL via ORAL
  Filled 2015-11-14: qty 30

## 2015-11-14 NOTE — ED Notes (Signed)
Pt able to ambulate to restroom using walker from home.

## 2015-11-14 NOTE — ED Triage Notes (Signed)
Per EMS - pt c/o center chest discomfort and abd pain since 0700 today. Pt has had similar pain intermittently x 9 years. Pt is smiling and appears to be in no apparent distress upon initial assessment. Hx anxiety and acid reflux. VSS.

## 2015-11-14 NOTE — Discharge Instructions (Signed)
Please read and follow all provided instructions.  Your diagnoses today include:  1. Gastric reflux     Tests performed today include: An EKG of your heart Vital signs. See below for your results today.   Medications prescribed:   Take any prescribed medications only as directed.  Follow-up instructions: Please follow-up with your primary care provider as soon as you can for further evaluation of your symptoms.   Return instructions:  SEEK IMMEDIATE MEDICAL ATTENTION IF: You have severe chest pain, especially if the pain is crushing or pressure-like and spreads to the arms, back, neck, or jaw, or if you have sweating, nausea (feeling sick to your stomach), or shortness of breath. THIS IS AN EMERGENCY. Don't wait to see if the pain will go away. Get medical help at once. Call 911 or 0 (operator). DO NOT drive yourself to the hospital.  Your chest pain gets worse and does not go away with rest.  You have an attack of chest pain lasting longer than usual, despite rest and treatment with the medications your caregiver has prescribed.  You wake from sleep with chest pain or shortness of breath. You feel dizzy or faint. You have chest pain not typical of your usual pain for which you originally saw your caregiver.  You have any other emergent concerns regarding your health.  Additional Information: Chest pain comes from many different causes. Your caregiver has diagnosed you as having chest pain that is not specific for one problem, but does not require admission.  You are at low risk for an acute heart condition or other serious illness.   Your vital signs today were: BP 106/73    Pulse 84    Resp 20    SpO2 100%  If your blood pressure (BP) was elevated above 135/85 this visit, please have this repeated by your doctor within one month. --------------

## 2015-11-14 NOTE — ED Notes (Signed)
On discharge pt did not know how to call Ms. Earlene Plateravis who she lives with at a transitional home.  Spoke with pt's sister who stated she has a legal guardian.  Contacted social work who knew the person I was speaking of and provided contact information for Jinny SandersBetty Davis, [phone].  Spoke with Ms. Earlene PlaterDavis who stated she would not be able to pick the pt up for a significant amount of time.  Asked if someone would be at the residence to let the [facility] in [facility] and she stated yes.

## 2015-11-14 NOTE — Discharge Planning (Signed)
EDCM reviewed discharging chart for possible CM needs.  No needs identified.    

## 2015-11-14 NOTE — ED Provider Notes (Signed)
[facility] Provider Note   CSN: [id number] Arrival date & time: 11/14/15  1200  History   Chief Complaint Chief Complaint  Patient presents with  . Chest Pain  . Abdominal Pain    HPI Karen Hart is a 20 y.o. female.  HPI  20 y.o. female with a hx of cerebellar ataxia, presents to the Emergency Department today complaining of epigastric and chest pain with onset this morning around 0700. Pt states that this is a chronic occurrence for her for the past 9 years. Notes that these episodes resolve spontaneously. States that the abdominal pain resolved, but she continues to have mild chest discomfort. No N/V/D. No SOB. No diaphoresis. Recent visit on 10-26-15 for same with unremarkable work up. No back pain. No fevers. No headaches. No other symptoms noted.     Past Medical History:  Diagnosis Date  . Acid reflux   . Cerebellar ataxia [facility](HCC)     Patient Active Problem List   Diagnosis Date Noted  . 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  OB History    No data available       Home Medications    Prior to Admission medications   Medication Sig Start Date End Date Taking? Authorizing Provider  acetaminophen (TYLENOL) 325 MG tablet Take 2 tablets (650 mg total) by mouth every 4 (four) hours as needed (as needed for pain). 07/27/15   Beau FannyJohn C Withrow, FNP  Calcium Carbonate-Vitamin D (CALCIUM-VITAMIN D) 500-200 MG-UNIT tablet Take 1 tablet by mouth daily. 07/27/15   Beau FannyJohn C Withrow, FNP  cetirizine (ZYRTEC) 10 MG tablet Take 1 tablet (10 mg total) by mouth daily. 07/27/15   Beau FannyJohn C Withrow, FNP  famotidine (PEPCID) 20 MG tablet Take 1 tablet (20 mg total) by mouth daily. 10/26/15   Trixie DredgeEmily West, PA-C  ferrous sulfate 325 (65 FE) MG EC tablet Take 1 tablet (325 mg total) by mouth daily with breakfast. Patient not taking: Reported on 10/26/2015 07/27/15   Beau FannyJohn C Withrow, FNP  ibuprofen (ADVIL,MOTRIN) 600 MG  tablet Take 1 tablet (600 mg total) by mouth every 6 (six) hours as needed for mild pain or moderate pain. 10/26/15   Trixie DredgeEmily West, PA-C    Family History No family history on file.  Social History Social History  Substance Use Topics  . Smoking status: Never Smoker  . Smokeless tobacco: Never Used  . Alcohol use Not on file     Allergies   Other; Lactose intolerance (gi); Latex; and Pork-derived products   Review of Systems Review of Systems ROS reviewed and all are negative for acute change except as noted in the HPI.  Physical Exam Updated Vital Signs SpO2 100%   Physical Exam  Constitutional: She is oriented to person, place, and time. Vital signs are normal. She appears well-developed and well-nourished.  HENT:  Head: Normocephalic and atraumatic.  Right Ear: Hearing normal.  Left Ear: Hearing normal.  Eyes: Conjunctivae and EOM are normal. Pupils are equal, round, and reactive to light.  Neck: Normal range of motion. Neck supple.  Cardiovascular: Normal rate, regular rhythm, normal heart sounds, intact distal pulses and normal pulses.   Pulmonary/Chest: Effort normal and breath sounds normal.  Abdominal: Soft. Normal appearance and bowel sounds are normal. There is no tenderness. There is no rigidity, no rebound, no guarding, no CVA tenderness, no tenderness at McBurney's point and negative Murphy's sign.  Neurological: She is alert and  oriented to person, place, and time.  Skin: Skin is warm and dry.  Psychiatric: She has a normal mood and affect. Her speech is normal and behavior is normal. Thought content normal.  Nursing note and vitals reviewed.  ED Treatments / Results  Labs (all labs ordered are listed, but only abnormal results are displayed) Labs Reviewed - No data to display  EKG  EKG Interpretation  Date/Time:  Wednesday November 14 2015 12:05:45 EDT Ventricular Rate:  87 PR Interval:    QRS Duration: 91 QT Interval:  368 QTC Calculation: 443 R  Axis:   58 Text Interpretation:  Sinus rhythm Normal ECG No significant change since last tracing Confirmed by Anitra Lauth  MD, Alphonzo Lemmings ([id number]) on 11/14/2015 12:15:17 PM      Radiology No results found.  Procedures Procedures (including critical care time)  Medications Ordered in ED Medications  gi cocktail (Maalox,Lidocaine,Donnatal) (not administered)     Initial Impression / Assessment and Plan / ED Course  I have reviewed the triage vital signs and the nursing notes.  Pertinent labs & imaging results that were available during my care of the patient were reviewed by me and considered in my medical decision making (see chart for details).  Clinical Course   Final Clinical Impressions(s) / ED Diagnoses  I have reviewed and evaluated the relevant laboratory values I have reviewed and evaluated the relevant imaging studies.  I have interpreted the relevant EKG. I have reviewed the relevant previous healthcare records. I have reviewed EMS Documentation. I obtained HPI from historian. Patient discussed with supervising physician  ED Course:  Assessment: Pt is a 20yF presents with CP this AM that is slightly epigastric as well. Hx same for past 9 years. Seen on 10-26-15 for same with resolution. This occurs intermittently. Risk Factors None. Given GI cocktail in ED with relief of symptoms. Patient is to be discharged with recommendation to follow up with PCP in regards to today's hospital visit. Chest pain is not likely of cardiac or pulmonary etiology d/t presentation, perc negative, VSS, no tracheal deviation, no JVD or new murmur, RRR, breath sounds equal bilaterally, EKG without acute abnormalities. Pt has been advised to resume PPI and return to the ED is CP becomes exertional, associated with diaphoresis or nausea, radiates to left jaw/arm, worsens or becomes concerning in any way. Given Rx Carafate as well. Pt appears reliable for follow up and is agreeable to discharge. Patient is in  no acute distress. Vital Signs are stable. Patient is able to ambulate. Patient able to tolerate PO.   Disposition/Plan:  DC Home Additional Verbal discharge instructions given and discussed with patient.  Pt Instructed to f/u with PCP in the next week for evaluation and treatment of symptoms. Return precautions given Pt acknowledges and agrees with plan  Supervising Physician Gwyneth Sprout, MD   Final diagnoses:  Gastric reflux    New Prescriptions New Prescriptions   No medications on file     Audry Pili, PA-C 11/14/15 1307    Gwyneth Sprout, MD 11/15/15 2204

## 2015-11-14 NOTE — ED Notes (Signed)
Attempted numerous times to contact Ms Earlene PlaterDavis about the pt and the pt's walker once [facility] arrived and stated they could not transport the walker.  This RN was in the middle of conversation with Ms Earlene PlaterDavis when this RN no longer heard Ms Earlene PlaterDavis on the phone.  This RN was trying to find out if she wanted the pt transported to the facility and someone to come get the walker later or to hold the pt here in the ED until someone can come get the pt and the walker at the same time.  This RN, [facility], and the secretary tried to call Ms. Davis back numerous times with no answer.  Spoke with Johnston EbbsKoula, Consulting civil engineerCharge RN who agrees to send pt home and have them come pick up pt's walker at a later time.  This RN attempted to leave a message on voicemail for Ms Earlene PlaterDavis with instructions on where to find the walker but voicemail was full.  A written note was on the discharge paperwork to speak with the pod A secretary or the charge RN to find the walker left in Pod A with numerous pt labels.

## 2016-09-17 ENCOUNTER — Encounter (HOSPITAL_COMMUNITY): Payer: Self-pay

## 2016-09-17 ENCOUNTER — Emergency Department (HOSPITAL_COMMUNITY)
Admission: EM | Admit: 2016-09-17 | Discharge: 2016-09-17 | Disposition: A | Discharge status: Home / Self Care | Payer: Medicaid Other | Attending: Emergency Medicine | Admitting: Emergency Medicine

## 2016-09-17 DIAGNOSIS — R112 Nausea with vomiting, unspecified: Secondary | ICD-10-CM | POA: Insufficient documentation

## 2016-09-17 DIAGNOSIS — R197 Diarrhea, unspecified: Secondary | ICD-10-CM | POA: Insufficient documentation

## 2016-09-17 LAB — COMPREHENSIVE METABOLIC PANEL
ALBUMIN: 4.3 g/dL (ref 3.5–5.0)
ALT: 27 U/L (ref 14–54)
AST: 23 U/L (ref 15–41)
Alkaline Phosphatase: 61 U/L (ref 38–126)
Anion gap: 8 (ref 5–15)
BILIRUBIN TOTAL: 0.6 mg/dL (ref 0.3–1.2)
BUN: 16 mg/dL (ref 6–20)
CHLORIDE: 104 mmol/L (ref 101–111)
CO2: 26 mmol/L (ref 22–32)
CREATININE: 0.73 mg/dL (ref 0.44–1.00)
Calcium: 9.1 mg/dL (ref 8.9–10.3)
GFR calc Af Amer: >60 mL/min (ref >60)
GLUCOSE: 119 mg/dL — AB (ref 65–99)
POTASSIUM: 3.5 mmol/L (ref 3.5–5.1)
Sodium: 138 mmol/L (ref 135–145)
TOTAL PROTEIN: 7.8 g/dL (ref 6.5–8.1)

## 2016-09-17 LAB — URINALYSIS, ROUTINE W REFLEX MICROSCOPIC
BILIRUBIN URINE: NEGATIVE
GLUCOSE, UA: NEGATIVE mg/dL
Hgb urine dipstick: NEGATIVE
KETONES UR: NEGATIVE mg/dL
LEUKOCYTES UA: NEGATIVE
Nitrite: NEGATIVE
PH: 5 (ref 5.0–8.0)
PROTEIN: NEGATIVE mg/dL
Specific Gravity, Urine: 1.014 (ref 1.005–1.030)

## 2016-09-17 LAB — CBC
HEMATOCRIT: 39 % (ref 36.0–46.0)
Hemoglobin: 12.6 g/dL (ref 12.0–15.0)
MCH: 24.7 pg — ABNORMAL LOW (ref 26.0–34.0)
MCHC: 32.3 g/dL (ref 30.0–36.0)
MCV: 76.3 fL — AB (ref 78.0–100.0)
PLATELETS: 353 10*3/uL (ref 150–400)
RBC: 5.11 MIL/uL (ref 3.87–5.11)
RDW: 15 % (ref 11.5–15.5)
WBC: 12.5 10*3/uL — AB (ref 4.0–10.5)

## 2016-09-17 LAB — LIPASE, BLOOD: Lipase: 25 U/L (ref 11–51)

## 2016-09-17 MED ORDER — ONDANSETRON 4 MG PO TBDP: 4.0000 mg | ORAL_TABLET | Freq: Once | ORAL | Status: DC | PRN

## 2016-09-17 MED ORDER — SODIUM CHLORIDE 0.9 % IV BOLUS (SEPSIS)
1000.0000 mL | Freq: Once | INTRAVENOUS | Status: AC
  Administered 2016-09-17: 1000 mL via INTRAVENOUS

## 2016-09-17 MED ORDER — ONDANSETRON HCL 4 MG/2ML IJ SOLN
4.0000 mg | Freq: Once | INTRAMUSCULAR | Status: AC
  Administered 2016-09-17: 4 mg via INTRAVENOUS
  Filled 2016-09-17: qty 2

## 2016-09-17 MED ORDER — FAMOTIDINE IN NACL 20-0.9 MG/50ML-% IV SOLN
20.0000 mg | Freq: Once | INTRAVENOUS | Status: AC
  Administered 2016-09-17: 20 mg via INTRAVENOUS
  Filled 2016-09-17: qty 50

## 2016-09-17 MED ORDER — ONDANSETRON HCL 4 MG PO TABS: 4.0000 mg | ORAL_TABLET | Freq: Three times a day (TID) | ORAL | 0 refills | Status: AC | PRN

## 2016-09-17 NOTE — ED Provider Notes (Signed)
[facility]-EMERGENCY DEPT Provider Note   CSN: [id number] Arrival date & time: 09/17/16  1144     History   Chief Complaint Chief Complaint  Patient presents with  . Emesis  . Nausea  . Diarrhea    HPI   Blood pressure 124/73, pulse 95, temperature 98 F (36.7 C), temperature source Oral, resp. rate 17, height 5\' 5"  (1.651 m), weight 68 kg (150 lb), SpO2 100 %.  Karen Hart is a 21 y.o. female complaining ofSingle episode of nonbloody, nonbilious, non-coffee ground emesis last night with reduced by mouth intake today. She also reports loose stool, single episode this a.m. She denies fevers, chills, abdominal pain, sick contacts.   Past Medical History:  Diagnosis Date  . Acid reflux   . 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 No pertinent surgical history.  OB History    No data available       Home Medications    Prior to Admission medications   Medication Sig Start Date End Date Taking? Authorizing Provider  acetaminophen (TYLENOL) 325 MG tablet Take 2 tablets (650 mg total) by mouth every 4 (four) hours as needed (as needed for pain). Patient taking differently: Take 650 mg by mouth every 4 (four) hours as needed for mild pain, moderate pain, fever or headache.  07/27/15  Yes Withrow, Everardo All, FNP  ondansetron (ZOFRAN) 4 MG tablet Take 1 tablet (4 mg total) by mouth every 8 (eight) hours as needed for nausea or vomiting. 09/17/16   Alandis Bluemel, Joni Reining, PA-C  pantoprazole (PROTONIX) 20 MG tablet Take 1 tablet (20 mg total) by mouth daily. Patient not taking: Reported on 09/17/2016 11/14/15   Audry Pili, PA-C  sucralfate (CARAFATE) 1 g tablet Take 1 tablet (1 g total) by mouth 2 (two) times daily. Patient not taking: Reported on 09/17/2016 11/14/15   Audry Pili, PA-C    Family History No family history on file.  Social  History Social History  Substance Use Topics  . Smoking status: Never Smoker  . Smokeless tobacco: Never Used  . Alcohol use Not on file     Allergies   Other; Carrot oil; and Latex   Review of Systems Review of Systems  A complete review of systems was obtained and all systems are negative except as noted in the HPI and PMH.    Physical Exam Updated Vital Signs BP 124/70 (BP Location: Left Arm)   Pulse 94   Temp 98.2 F (36.8 C) (Oral)   Resp 18   Ht 5\' 5"  (1.651 m)   Wt 68 kg (150 lb)   SpO2 100%   BMI 24.96 kg/m   Physical Exam  Constitutional: She is oriented to person, place, and time. She appears well-developed and well-nourished. No distress.  HENT:  Head: Normocephalic and atraumatic.  Mouth/Throat: Oropharynx is clear and moist.  Eyes: Pupils are equal, round, and reactive to light. Conjunctivae and EOM are normal.  Neck: Normal range of motion.  Cardiovascular: Normal rate, regular rhythm and intact distal pulses.   Pulmonary/Chest: Effort normal and breath sounds normal.  Abdominal: Soft. There is no tenderness.  Musculoskeletal: Normal range of motion.  Neurological: She is alert and oriented to person, place, and time.  Skin: She is not diaphoretic.  Psychiatric: She has a normal mood and affect.  Nursing note and vitals reviewed.  ED Treatments / Results  Labs (all labs ordered are listed, but only abnormal results are displayed) Labs Reviewed  COMPREHENSIVE METABOLIC PANEL - Abnormal; Notable for the following:       Result Value   Glucose, Bld 119 (*)    All other components within normal limits  CBC - Abnormal; Notable for the following:    WBC 12.5 (*)    MCV 76.3 (*)    MCH 24.7 (*)    All other components within normal limits  LIPASE, BLOOD  URINALYSIS, ROUTINE W REFLEX MICROSCOPIC  I-STAT BETA HCG BLOOD, ED ([facility], [facility], [facility] ONLY)    EKG  EKG Interpretation None       Radiology No results found.  Procedures Procedures  (including critical care time)  Medications Ordered in ED Medications  ondansetron (ZOFRAN-ODT) disintegrating tablet 4 mg (not administered)  sodium chloride 0.9 % bolus 1,000 mL (0 mLs Intravenous Stopped 09/17/16 1443)  ondansetron (ZOFRAN) injection 4 mg (4 mg Intravenous Given 09/17/16 1340)  famotidine (PEPCID) IVPB 20 mg premix (0 mg Intravenous Stopped 09/17/16 1443)     Initial Impression / Assessment and Plan / ED Course  I have reviewed the triage vital signs and the nursing notes.  Pertinent labs & imaging results that were available during my care of the patient were reviewed by me and considered in my medical decision making (see chart for details).    Vitals:   09/17/16 1152 09/17/16 1241 09/17/16 1448  BP: 124/73  124/70  Pulse: 95  94  Resp: 17  18  Temp: 98 F (36.7 C)  98.2 F (36.8 C)  TempSrc: Oral  Oral  SpO2: 100%  100%  Weight:  68 kg (150 lb)   Height:  5\' 5"  (1.651 m)     Medications  ondansetron (ZOFRAN-ODT) disintegrating tablet 4 mg (not administered)  sodium chloride 0.9 % bolus 1,000 mL (0 mLs Intravenous Stopped 09/17/16 1443)  ondansetron (ZOFRAN) injection 4 mg (4 mg Intravenous Given 09/17/16 1340)  famotidine (PEPCID) IVPB 20 mg premix (0 mg Intravenous Stopped 09/17/16 1443)    Karen Hart is 21 y.o. female presenting with Nausea vomiting diarrhea onset within the last 24 hours, abdominal exam benign, patient afebrile, nontoxic and well-hydrated. Blood work reassuring. Repeat abdominal exam benign. Patient tolerating by mouth. Likely viral gastroenteritis. Distress home with Zofran, extensive discussion of return precautions and patient verbalized understanding and teach back technique.  Evaluation does not show pathology that would require ongoing emergent intervention or inpatient treatment. Pt is hemodynamically stable and mentating appropriately. Discussed findings and plan with patient/guardian, who agrees with care plan. All questions  answered. Return precautions discussed and outpatient follow up given.      Final Clinical Impressions(s) / ED Diagnoses   Final diagnoses:  Nausea vomiting and diarrhea    New Prescriptions New Prescriptions   ONDANSETRON (ZOFRAN) 4 MG TABLET    Take 1 tablet (4 mg total) by mouth every 8 (eight) hours as needed for nausea or vomiting.     Kaylyn Lim 09/17/16 1516    Lorre Nick, MD 09/18/16 1009

## 2016-09-17 NOTE — ED Notes (Signed)
Pt is alert and oriented x 4 and is verbally responsive.  

## 2016-09-17 NOTE — ED Notes (Signed)
Pt given sprite and sandwich  

## 2016-09-17 NOTE — Discharge Instructions (Signed)

## 2016-09-17 NOTE — ED Notes (Signed)
[facility] called. [facility] contacted no answer to make aware pt is coming home and to Give Discharge information. Discharge infor sent with Pt.

## 2016-09-17 NOTE — ED Notes (Signed)
Bed: UQ33 Expected date:  Expected time:  Means of arrival:  Comments: EMS n/v group home

## 2016-09-17 NOTE — ED Triage Notes (Signed)
Pt brought in by EMS from Group home. Pt c/o of N/V x 1 week, and has been unable to keep foods down. Pt  Had 1 episode of diarrhea this morning. H/x of spinal cerebral ataxia. Pt is wheelchair bound.    BP 110/80 HR 100, RR 16,

## 2016-11-18 ENCOUNTER — Encounter (HOSPITAL_COMMUNITY): Payer: Self-pay

## 2016-11-18 ENCOUNTER — Emergency Department (HOSPITAL_COMMUNITY)
Admission: EM | Admit: 2016-11-18 | Discharge: 2016-11-19 | Disposition: A | Discharge status: Skilled Nursing Facility | Payer: Self-pay | Attending: Emergency Medicine | Admitting: Emergency Medicine

## 2016-11-18 DIAGNOSIS — Z79899 Other long term (current) drug therapy: Secondary | ICD-10-CM | POA: Insufficient documentation

## 2016-11-18 DIAGNOSIS — N390 Urinary tract infection, site not specified: Secondary | ICD-10-CM | POA: Insufficient documentation

## 2016-11-18 DIAGNOSIS — Z91040 Latex allergy status: Secondary | ICD-10-CM | POA: Insufficient documentation

## 2016-11-18 DIAGNOSIS — R109 Unspecified abdominal pain: Secondary | ICD-10-CM | POA: Insufficient documentation

## 2016-11-18 HISTORY — DX: Hereditary ataxia, unspecified: G11.9

## 2016-11-18 LAB — URINALYSIS, ROUTINE W REFLEX MICROSCOPIC
GLUCOSE, UA: NEGATIVE mg/dL
Ketones, ur: 5 mg/dL — AB
NITRITE: POSITIVE — AB
Protein, ur: 30 mg/dL — AB
SPECIFIC GRAVITY, URINE: 1.03 (ref 1.005–1.030)
pH: 5 (ref 5.0–8.0)

## 2016-11-18 LAB — POC URINE PREG, ED: Preg Test, Ur: NEGATIVE

## 2016-11-18 MED ORDER — CEPHALEXIN 500 MG PO CAPS
500.0000 mg | ORAL_CAPSULE | Freq: Once | ORAL | Status: AC
  Administered 2016-11-18: 500 mg via ORAL
  Filled 2016-11-18: qty 1

## 2016-11-18 MED ORDER — CEPHALEXIN 500 MG PO CAPS: 500.0000 mg | ORAL_CAPSULE | Freq: Two times a day (BID) | ORAL | 0 refills | Status: AC

## 2016-11-18 NOTE — ED Triage Notes (Signed)
Pt c/o abdominal pain x 1 week and UTI like symptoms, foul smelling urine  Temp 100.3 BP 122/74 HR 100 Resp 16 98% Sat Room Air  Pt took 1gm Tylenol x 12 hours

## 2016-11-18 NOTE — Discharge Instructions (Signed)
Your workup showed evidence of urinary tract infection.  I suspect this is the cause of your abdominal discomfort and your foul-smelling urine.  Please take the antibiotics to treat the infection.  If any symptoms change or worsen, please return to the nearest emergency department.  Please follow up with your primary care physician for reassessment and further management.

## 2016-11-18 NOTE — ED Provider Notes (Signed)
[facility]-EMERGENCY DEPT Provider Note   CSN: [id number] Arrival date & time: 11/18/16  1747     History   Chief Complaint Chief Complaint  Patient presents with  . Abdominal Pain    HPI Karen Hart is a 21 y.o. female.  The history is provided by the patient and medical records. No language interpreter was used.  Abdominal Pain   This is a new problem. The current episode started more than 2 days ago. The problem occurs constantly. The problem has not changed since onset.The pain is associated with an unknown factor. The pain is located in the periumbilical region. The pain is moderate. Pertinent negatives include fever, diarrhea, nausea, vomiting, constipation, dysuria (foul smelling), frequency, hematuria and headaches. Nothing aggravates the symptoms. Nothing relieves the symptoms. Her past medical history is significant for GERD.    Past Medical History:  Diagnosis Date  . Acid reflux   . Cerebellar ataxia (HCC)   . Cerebral ataxia [facility])     Patient Active Problem List   Diagnosis Date Noted  . 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 No pertinent surgical history.  OB History    Gravida Para Term Preterm AB Living   0 0 0 0 0 0   SAB TAB Ectopic Multiple Live Births   0 0 0 0 0       Home Medications    Prior to Admission medications   Medication Sig Start Date End Date Taking? Authorizing Provider  acetaminophen (TYLENOL) 500 MG tablet Take 1,000 mg by mouth daily as needed for mild pain.   Yes [provider]  calcium-vitamin D (OSCAL WITH D) 500-200 MG-UNIT tablet Take 1 tablet by mouth.   Yes [provider]  Fexofenadine HCl (ALLEGRA ALLERGY PO) Take 1 tablet by mouth daily.   Yes [provider]  pantoprazole (PROTONIX) 20 MG tablet Take 1 tablet (20 mg total) by mouth daily. 11/14/15  Yes Audry Pili, PA-C  acetaminophen (TYLENOL) 325 MG  tablet Take 2 tablets (650 mg total) by mouth every 4 (four) hours as needed (as needed for pain). Patient taking differently: Take 650 mg by mouth every 4 (four) hours as needed for mild pain, moderate pain, fever or headache.  07/27/15   Withrow, Everardo All, FNP  ondansetron (ZOFRAN) 4 MG tablet Take 1 tablet (4 mg total) by mouth every 8 (eight) hours as needed for nausea or vomiting. Patient not taking: Reported on 11/18/2016 09/17/16   Pisciotta, Joni Reining, PA-C  sucralfate (CARAFATE) 1 g tablet Take 1 tablet (1 g total) by mouth 2 (two) times daily. Patient not taking: Reported on 09/17/2016 11/14/15   Audry Pili, PA-C    Family History History reviewed. No pertinent family history.  Social History Social History  Substance Use Topics  . Smoking status: Never Smoker  . Smokeless tobacco: Never Used  . Alcohol use No     Allergies   Other; Carrot oil; and Latex   Review of Systems Review of Systems  Constitutional: Negative for chills, diaphoresis, fatigue and fever.  HENT: Negative for congestion and rhinorrhea.   Eyes: Negative for visual disturbance.  Respiratory: Negative for chest tightness and shortness of breath.   Cardiovascular: Negative for chest pain and palpitations.  Gastrointestinal: Positive for abdominal pain. Negative for constipation, diarrhea, nausea and vomiting.  Genitourinary: Negative for decreased urine volume, dysuria (foul smelling), flank pain, frequency, hematuria, vaginal bleeding and vaginal discharge.  Musculoskeletal:  Negative for back pain, neck pain and neck stiffness.  Skin: Negative for rash and wound.  Neurological: Negative for headaches.  Psychiatric/Behavioral: Negative for agitation and confusion.  All other systems reviewed and are negative.    Physical Exam Updated Vital Signs BP 114/87 (BP Location: Right Arm)   Pulse 99   Temp 97.7 F (36.5 C) (Oral)   Ht 5\' 5"  (1.651 m)   Wt 71.2 kg (157 lb)   SpO2 100%   BMI 26.13 kg/m    Physical Exam  Constitutional: She is oriented to person, place, and time. She appears well-developed and well-nourished. No distress.  HENT:  Head: Normocephalic and atraumatic.  Mouth/Throat: Oropharynx is clear and moist. No oropharyngeal exudate.  Eyes: Pupils are equal, round, and reactive to light. Conjunctivae are normal.  Neck: Normal range of motion. Neck supple.  Cardiovascular: Normal rate and regular rhythm.   No murmur heard. Pulmonary/Chest: Effort normal and breath sounds normal. No stridor. No respiratory distress. She has no wheezes. She exhibits no tenderness.  Abdominal: Soft. There is no tenderness.  Musculoskeletal: She exhibits no edema or tenderness.  Neurological: She is alert and oriented to person, place, and time. No sensory deficit. She exhibits normal muscle tone.  Skin: Skin is warm and dry. Capillary refill takes less than 2 seconds. No rash noted. She is not diaphoretic. No erythema.  Psychiatric: She has a normal mood and affect.  Nursing note and vitals reviewed.    ED Treatments / Results  Labs (all labs ordered are listed, but only abnormal results are displayed) Labs Reviewed  URINALYSIS, ROUTINE W REFLEX MICROSCOPIC - Abnormal; Notable for the following:       Result Value   Color, Urine AMBER (*)    APPearance CLOUDY (*)    Hgb urine dipstick SMALL (*)    Bilirubin Urine SMALL (*)    Ketones, ur 5 (*)    Protein, ur 30 (*)    Nitrite POSITIVE (*)    Leukocytes, UA TRACE (*)    Bacteria, UA RARE (*)    Squamous Epithelial / LPF 6-30 (*)    All other components within normal limits  POC URINE PREG, ED    EKG  EKG Interpretation None       Radiology No results found.  Procedures Procedures (including critical care time)  Medications Ordered in ED Medications  cephALEXin (KEFLEX) capsule 500 mg (500 mg Oral Given 11/18/16 2328)     Initial Impression / Assessment and Plan / ED Course  I have reviewed the triage vital  signs and the nursing notes.  Pertinent labs & imaging results that were available during my care of the patient were reviewed by me and considered in my medical decision making (see chart for details).     Karen Hart is a 21 y.o. female with a past medical history significant for cerebral and cerebellar ataxia as well as esophageal reflux who presents with abdominal pain and foul-smelling urine.  Patient thinks that she has a urinary tract infection as she feels similar to prior episode.  She denies fevers, chills, nausea, vomiting, cuts patient, or diarrhea.  She says she is having some abdominal sharp pain in her mid lower abdomen that she says is a 9 out of 10 at times.  On my exam, patient had no abdominal tenderness.  Patient says she has no dysuria but reports the foul-smelling is new.  She denies changes in frequency.  Patient has no flank pain or back  pain.  She has no chest pain or shortness of breath.  She denies other complaints.  On exam, patient's mid and lower abdomen are nontender.  No CVA tenderness.  Patient appears well.  Lungs are clear and chest is nontender.  Patient has ataxia with finger-nose-finger testing bilaterally which she reports is unchanged from prior.  As patient's abdomen is completely benign, do not feel patient needs imaging or blood work on arrival.  Patient will have urinalysis as she thinks she has a UTI.  Shared decision making conversation held in regards to getting urinalysis before doing any blood work.  Anticipate treating for UTI if urine shows this.  UTI confirmed on urinalysis.  Patient was given Keflex.  Patient given a prescription for Keflex.  Patient reports her pain is resolved.  Patient will be discharged with instructions to follow PCP.  Patient voiced understanding the plan of care.had no other worsens or concerns.  Patient discharged in good condition.    Final Clinical Impressions(s) / ED Diagnoses   Final diagnoses:  Abdominal  pain, unspecified abdominal location  Lower urinary tract infectious disease    New Prescriptions New Prescriptions   CEPHALEXIN (KEFLEX) 500 MG CAPSULE    Take 1 capsule (500 mg total) by mouth 2 (two) times daily.    Clinical Impression: 1. Abdominal pain, unspecified abdominal location   2. Lower urinary tract infectious disease     Disposition: Discharge  Condition: Good  I have discussed the results, Dx and Tx plan with the pt(& family if present). He/she/they expressed understanding and agree(s) with the plan. Discharge instructions discussed at great length. Strict return precautions discussed and pt &/or family have verbalized understanding of the instructions. No further questions at time of discharge.    New Prescriptions   CEPHALEXIN (KEFLEX) 500 MG CAPSULE    Take 1 capsule (500 mg total) by mouth 2 (two) times daily.    Follow Up: Jackie Plum, MD [address] [place] [address] [phone]     [facility] [address] [id number] mc [address] [place] [address] [phone]       Tegeler, Canary Brim, MD 11/19/16 [phone]

## 2016-11-18 NOTE — ED Notes (Signed)
[facility] called for transportation  

## 2016-11-19 NOTE — ED Notes (Signed)
[facility] no answer at this [facility]. Will attempt to call again.

## 2018-08-06 IMAGING — DX DG CHEST 2V
2 series · 2 of 2 positions shown · non-contrast
Comparison: None.

CLINICAL DATA: Chest pain.

EXAM:
CHEST  2 VIEW

[chest pa]
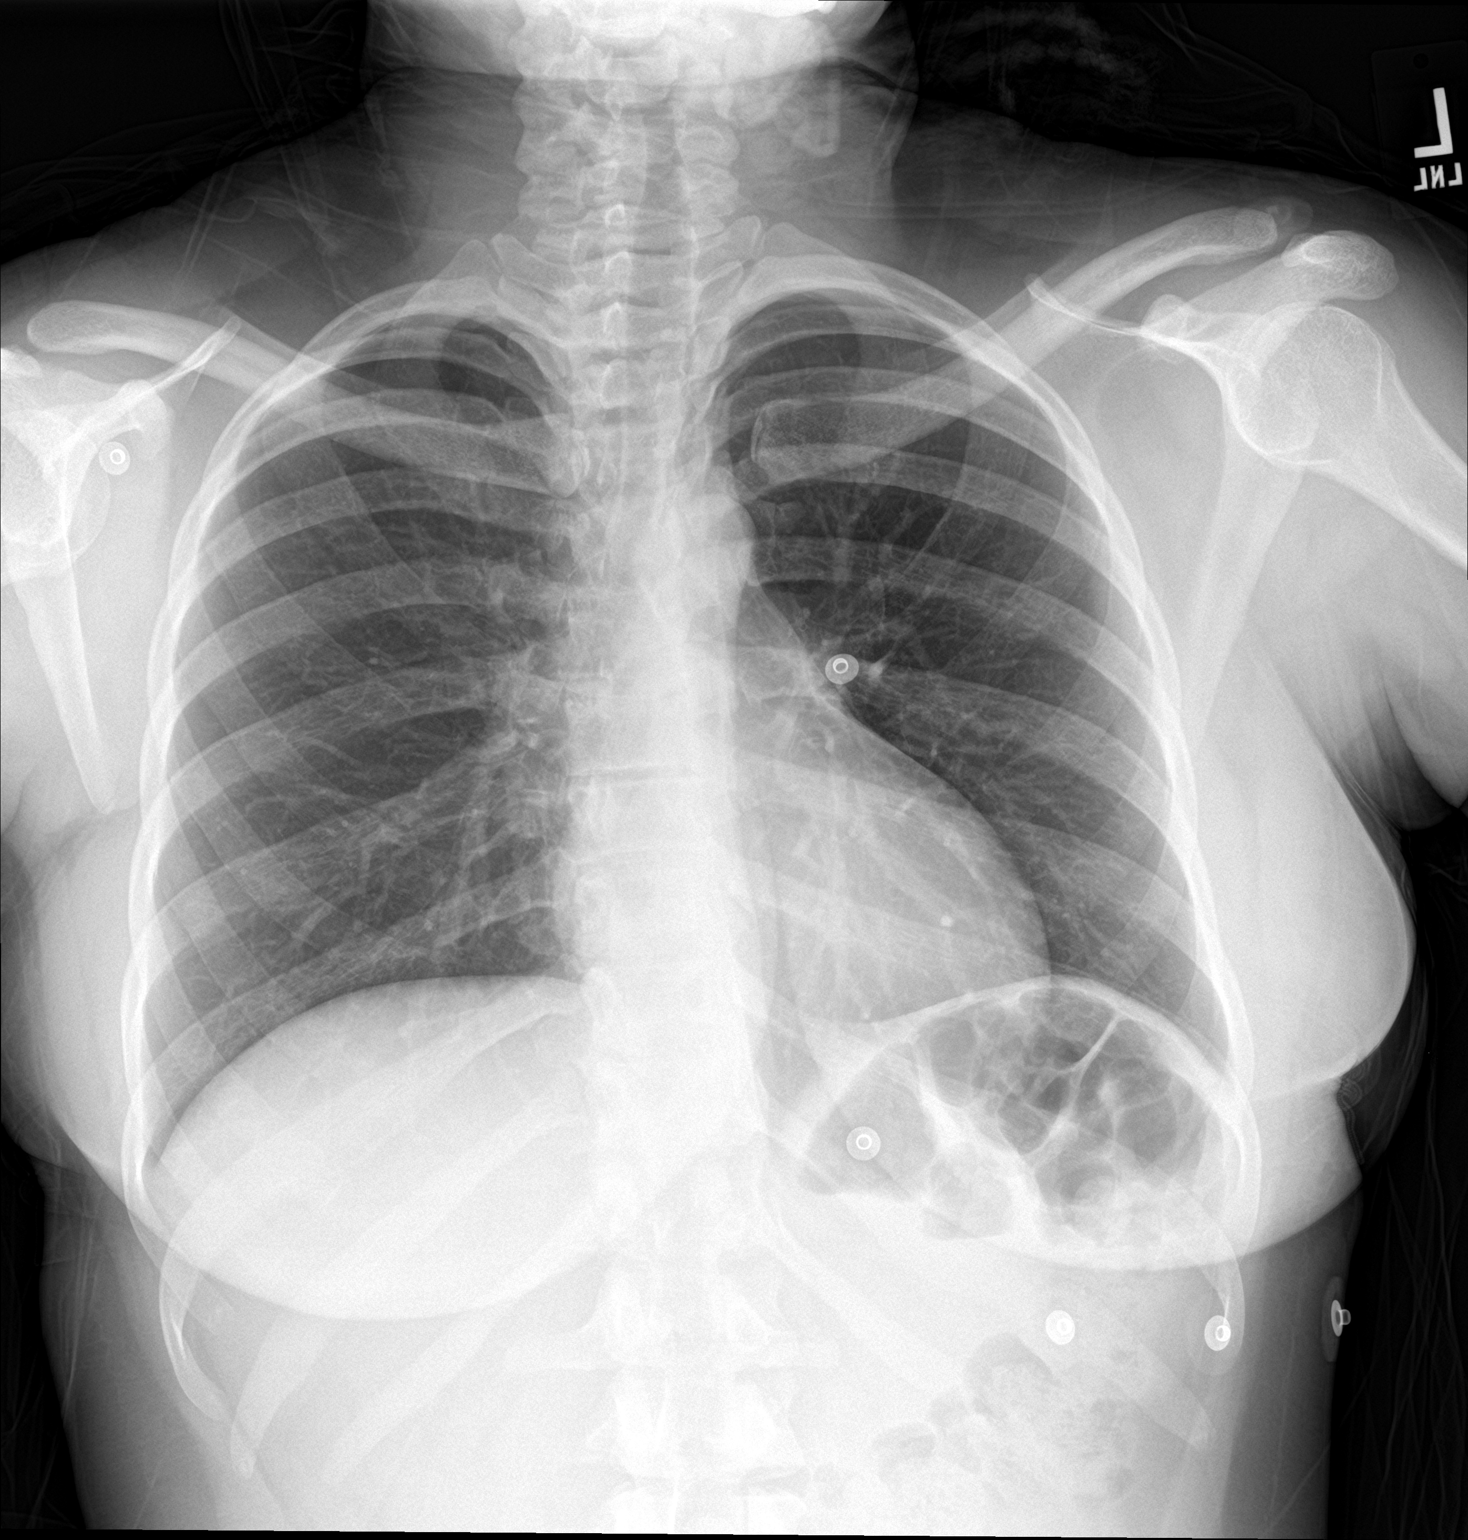

[chest lat]
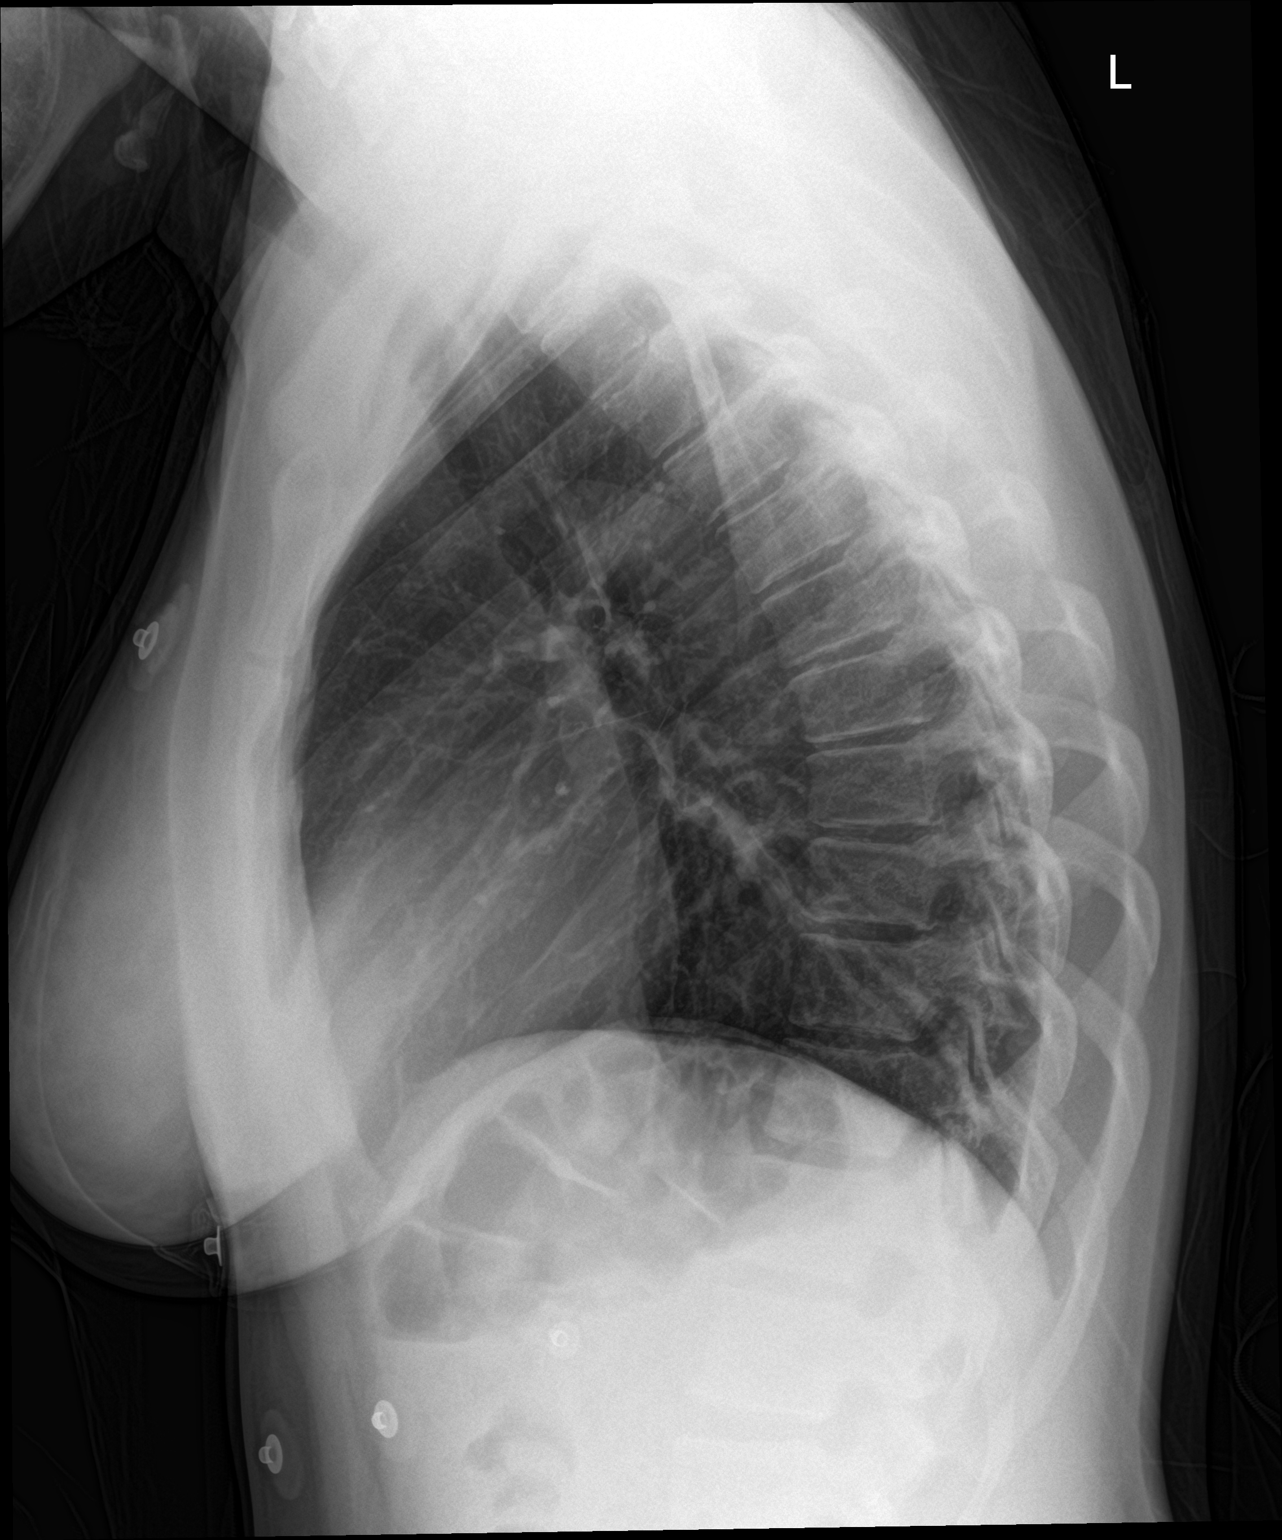

[2 of 2 positions shown; findings below may reference images not displayed]

FINDINGS: The heart size and mediastinal contours are within normal limits.
Both lungs are clear. No pneumothorax or pleural effusion is noted.
The visualized skeletal structures are unremarkable.
IMPRESSION: No active cardiopulmonary disease.

## 2022-02-27 DEATH — deceased
